# Patient Record
Sex: Male | Born: 1955
Health system: Southern US, Community
[De-identification: ages and names within clinical notes are randomized; demographics above are authoritative.]

## PROBLEM LIST (undated history)

## (undated) DIAGNOSIS — R251 Tremor, unspecified: Secondary | ICD-10-CM

## (undated) DIAGNOSIS — M419 Scoliosis, unspecified: Secondary | ICD-10-CM

## (undated) DIAGNOSIS — C4491 Basal cell carcinoma of skin, unspecified: Secondary | ICD-10-CM

## (undated) DIAGNOSIS — N200 Calculus of kidney: Secondary | ICD-10-CM

## (undated) DIAGNOSIS — G43009 Migraine without aura, not intractable, without status migrainosus: Secondary | ICD-10-CM

## (undated) DIAGNOSIS — R413 Other amnesia: Secondary | ICD-10-CM

## (undated) DIAGNOSIS — M5430 Sciatica, unspecified side: Secondary | ICD-10-CM

## (undated) HISTORY — DX: Other amnesia: R41.3

## (undated) HISTORY — PX: SHOULDER SURGERY: SHX246

## (undated) HISTORY — DX: Tremor, unspecified: R25.1

## (undated) HISTORY — PX: KIDNEY STONE SURGERY: SHX686

## (undated) HISTORY — DX: Migraine without aura, not intractable, without status migrainosus: G43.009

## (undated) HISTORY — DX: Basal cell carcinoma of skin, unspecified: C44.91

## (undated) HISTORY — DX: Sciatica, unspecified side: M54.30

## (undated) HISTORY — PX: HERNIA REPAIR: SHX51

---

## 1997-09-11 ENCOUNTER — Emergency Department (HOSPITAL_COMMUNITY): Admission: EM | Admit: 1997-09-11 | Discharge: 1997-09-11 | Payer: Self-pay | Admitting: Emergency Medicine

## 1999-07-02 ENCOUNTER — Encounter: Payer: Self-pay | Admitting: Emergency Medicine

## 1999-07-02 ENCOUNTER — Emergency Department (HOSPITAL_COMMUNITY): Admission: EM | Admit: 1999-07-02 | Discharge: 1999-07-02 | Payer: Self-pay | Admitting: Emergency Medicine

## 1999-12-22 ENCOUNTER — Ambulatory Visit (HOSPITAL_BASED_OUTPATIENT_CLINIC_OR_DEPARTMENT_OTHER): Admission: RE | Admit: 1999-12-22 | Discharge: 1999-12-22 | Payer: Self-pay | Admitting: Family Medicine

## 2000-02-28 ENCOUNTER — Ambulatory Visit (HOSPITAL_BASED_OUTPATIENT_CLINIC_OR_DEPARTMENT_OTHER): Admission: RE | Admit: 2000-02-28 | Discharge: 2000-02-28 | Payer: Self-pay | Admitting: General Surgery

## 2000-12-13 ENCOUNTER — Other Ambulatory Visit: Admission: RE | Admit: 2000-12-13 | Discharge: 2000-12-13 | Payer: Self-pay | Admitting: Urology

## 2005-10-11 ENCOUNTER — Ambulatory Visit (HOSPITAL_BASED_OUTPATIENT_CLINIC_OR_DEPARTMENT_OTHER): Admission: RE | Admit: 2005-10-11 | Discharge: 2005-10-12 | Payer: Self-pay | Admitting: Orthopedic Surgery

## 2012-06-13 ENCOUNTER — Emergency Department (INDEPENDENT_AMBULATORY_CARE_PROVIDER_SITE_OTHER): Payer: BC Managed Care – PPO

## 2012-06-13 ENCOUNTER — Emergency Department (HOSPITAL_COMMUNITY)
Admission: EM | Admit: 2012-06-13 | Discharge: 2012-06-13 | Disposition: A | Discharge status: Home / Self Care | Payer: BC Managed Care – PPO | Source: Home / Self Care

## 2012-06-13 ENCOUNTER — Encounter (HOSPITAL_COMMUNITY): Payer: Self-pay | Admitting: *Deleted

## 2012-06-13 DIAGNOSIS — R109 Unspecified abdominal pain: Secondary | ICD-10-CM

## 2012-06-13 DIAGNOSIS — K5900 Constipation, unspecified: Secondary | ICD-10-CM

## 2012-06-13 HISTORY — DX: Calculus of kidney: N20.0

## 2012-06-13 HISTORY — DX: Scoliosis, unspecified: M41.9

## 2012-06-13 LAB — POCT I-STAT, CHEM 8
BUN: 16 mg/dL (ref 6–23)
Calcium, Ion: 1.16 mmol/L (ref 1.12–1.23)
Chloride: 102 mEq/L (ref 96–112)
Creatinine, Ser: 1.2 mg/dL (ref 0.50–1.35)
Glucose, Bld: 98 mg/dL (ref 70–99)
HCT: 44 % (ref 39.0–52.0)
Hemoglobin: 15 g/dL (ref 13.0–17.0)
Potassium: 4 mEq/L (ref 3.5–5.1)
Sodium: 141 mEq/L (ref 135–145)
TCO2: 28 mmol/L (ref 0–100)

## 2012-06-13 LAB — POCT URINALYSIS DIP (DEVICE)
Bilirubin Urine: NEGATIVE
Glucose, UA: NEGATIVE mg/dL
Hgb urine dipstick: NEGATIVE
Ketones, ur: NEGATIVE mg/dL
Leukocytes, UA: NEGATIVE
Nitrite: NEGATIVE
Protein, ur: NEGATIVE mg/dL
Specific Gravity, Urine: >=1.03 (ref 1.005–1.030)
Urobilinogen, UA: 0.2 mg/dL (ref 0.0–1.0)
pH: 5.5 (ref 5.0–8.0)

## 2012-06-13 NOTE — ED Provider Notes (Signed)
History     CSN: [id number]  Arrival date & time 06/13/12  1608   None     Chief Complaint  Patient presents with  . Abdominal Pain    (Consider location/radiation/quality/duration/timing/severity/associated sxs/prior treatment) HPI Comments: 57 year old male is complaining of right mid abdominal pain radiating through to the right mid to low back. It began yesterday morning and has persisted throughout today. The pain is constant. It is worse when riding over bumps in the road. He describes it as a sharp, stabbing or burning pain. It is nonradiating. He has never had similar pain. It is not like previous pain associated with kidney stones.   Past Medical History  Diagnosis Date  . Kidney stones   . Scoliosis     Past Surgical History  Procedure Laterality Date  . Hernia repair    . Shoulder surgery Right   . Kidney stone surgery      History reviewed. No pertinent family history.  History  Substance Use Topics  . Smoking status: Never Smoker   . Smokeless tobacco: Not on file  . Alcohol Use: Yes     Comment: occasional      Review of Systems  Constitutional: Negative.   Respiratory: Negative.   Cardiovascular: Negative.   Gastrointestinal: Negative.   Genitourinary: Negative.   Musculoskeletal: Positive for back pain.  Skin: Negative.   Neurological: Negative.   Hematological: Negative.   Psychiatric/Behavioral: Negative.     Allergies  Ivp dye and Sulfites  Home Medications  No current outpatient prescriptions on file.  BP 126/81  Pulse 86  Temp(Src) 98.1 F (36.7 C) (Oral)  Resp 16  SpO2 97%  Physical Exam  Nursing note and vitals reviewed. Constitutional: He appears well-developed and well-nourished. No distress.  Eyes: EOM are normal.  Neck: Normal range of motion. Neck supple.  Cardiovascular: Normal rate, regular rhythm and normal heart sounds.   Pulmonary/Chest: Effort normal and breath sounds normal. No respiratory distress.   Abdominal: Soft. Bowel sounds are normal. He exhibits no mass. There is no rebound and no guarding.  Mild reproducible tenderness in the right midabdomen approximately 5 cm right of the umbilicus. No right lower quadrant tenderness. No Murphy sign.  Musculoskeletal:  Due to chronic spinal disorder he has chronic intermittent low back pain it radiates to the right. There is mild tenderness to the area for which the abdominal pain radiates however this is different from the pain for which he presents today.  Neurological: He is alert. He exhibits normal muscle tone.  Skin: Skin is warm. No rash noted. No erythema.  Psychiatric: He has a normal mood and affect.    ED Course  Procedures (including critical care time)  Labs Reviewed  POCT URINALYSIS DIP (DEVICE)  POCT I-STAT, CHEM 8   Dg Abd 1 View  06/13/2012  *RADIOLOGY REPORT*  Clinical Data: Right abdominal pain  ABDOMEN - 1 VIEW  Comparison: None.  Findings: Nonobstructive bowel gas pattern.  Moderate stool in the right colon.  Calcified phleboliths in the left pelvis.  Lumbar dextroscoliosis.  IMPRESSION: Unremarkable abdominal radiograph.   Original Report Authenticated By: Charline Bills, M.D.      1. Abdominal  pain, other specified site   2. Constipation       MDM  I-STAT and urinalysis are normal. KUB shows normal stool and gas pattern however there is a copious amount of stool in the ascending and descending colon and rectum. I suspect that with a negative review of systems  that he is having intestinal/gas pain. Recommend using MiraLax increase fluids and fiber. For worsening pain he symptoms problems, fever, vomiting or to the emergency department. Otherwise, followup with her primary care doctor.        Hayden Rasmussen, NP 06/13/12 1936

## 2012-06-13 NOTE — ED Provider Notes (Signed)
Medical screening examination/treatment/procedure(s) were performed by resident physician or non-physician practitioner and as supervising physician I was immediately available for consultation/collaboration.   Teagan Ozawa DOUGLAS MD.   Helia Haese D Chloe Baig, MD 06/13/12 1957 

## 2012-06-13 NOTE — ED Notes (Signed)
C/o R sided abdominal pain beside umbilicus and R flank pain onset yesterday as a dull pain. Today pain is sharper and burning. Hx. Of kidney stones calcium based and gall bladder pain.  No urinary symptoms or hematuria.  C/o nasuea today. No vomiting, diarrhea or constipation.  No chills or fever.

## 2012-11-05 ENCOUNTER — Encounter: Payer: Self-pay | Admitting: Neurology

## 2012-11-05 DIAGNOSIS — R413 Other amnesia: Secondary | ICD-10-CM | POA: Insufficient documentation

## 2012-11-05 DIAGNOSIS — G250 Essential tremor: Secondary | ICD-10-CM | POA: Insufficient documentation

## 2012-11-05 DIAGNOSIS — G43009 Migraine without aura, not intractable, without status migrainosus: Secondary | ICD-10-CM | POA: Insufficient documentation

## 2012-11-10 ENCOUNTER — Encounter: Payer: Self-pay | Admitting: Neurology

## 2012-11-10 ENCOUNTER — Ambulatory Visit (INDEPENDENT_AMBULATORY_CARE_PROVIDER_SITE_OTHER): Payer: BC Managed Care – PPO | Admitting: Neurology

## 2012-11-10 VITALS — BP 131/84 | HR 88 | Ht 67.5 in | Wt 178.0 lb

## 2012-11-10 DIAGNOSIS — M5430 Sciatica, unspecified side: Secondary | ICD-10-CM

## 2012-11-10 DIAGNOSIS — G43009 Migraine without aura, not intractable, without status migrainosus: Secondary | ICD-10-CM

## 2012-11-10 DIAGNOSIS — R413 Other amnesia: Secondary | ICD-10-CM

## 2012-11-10 DIAGNOSIS — M5432 Sciatica, left side: Secondary | ICD-10-CM

## 2012-11-10 DIAGNOSIS — G250 Essential tremor: Secondary | ICD-10-CM

## 2012-11-10 HISTORY — DX: Sciatica, unspecified side: M54.30

## 2012-11-10 MED ORDER — MELOXICAM 15 MG PO TABS: 15.0000 mg | ORAL_TABLET | Freq: Every day | ORAL | Status: DC

## 2012-11-10 NOTE — Progress Notes (Signed)
Reason for visit: Headache  Sean Pugh is an 57 y.o. male  History of present illness:  Sean Pugh is a 57 year old right-handed white male with a history of migraine headaches. The patient has done fairly well with his headaches, having less than one headache a month. The patient will take over the counter headache medications with good improvement. The patient indicates that the headaches are not disrupting his life. In March 2014, the patient fell and injured his low back. The patient has had some intermittent left sided low back pain, with occasional tingling discomfort down the left leg that goes into the big toe and the second and third toes. The patient denies any weakness of the leg, and he indicates that the symptoms are intermittent, not constant. The patient will put BenGay ointment on his back, and this will help his symptoms. The patient is receiving massage therapy, and he has an inversion frame at home. The patient denies any significant issues with memory, and he indicates that this is a stable issue. The patient returns for an evaluation.  Past Medical History  Diagnosis Date  . Kidney stones   . Scoliosis   . Memory loss   . Tremor   . Migraine headache without aura   . Sciatica 11/10/2012    Left-sided sciatica    Past Surgical History  Procedure Laterality Date  . Hernia repair    . Shoulder surgery Right   . Kidney stone surgery      Family History  Problem Relation Age of Onset  . Cancer Father   . Stroke Paternal Grandmother   . Cancer Paternal Grandfather   . Multiple sclerosis Cousin   . Diabetes Cousin   . Migraines Son     Social history:  reports that he has never smoked. He has never used smokeless tobacco. He reports that  drinks alcohol. He reports that he does not use illicit drugs.    Allergies  Allergen Reactions  . Ivp Dye [Iodinated Diagnostic Agents]     Swelling, rash  . Sulfites     Breathing,rash, breakout    Medications:   Current Outpatient Prescriptions on File Prior to Visit  Medication Sig Dispense Refill  . aspirin 81 MG tablet Take 81 mg by mouth daily.      . Multiple Vitamin (MULTIVITAMIN) tablet Take 1 tablet by mouth daily.      . pantoprazole (PROTONIX) 40 MG tablet Take 40 mg by mouth daily.       No current facility-administered medications on file prior to visit.    ROS:  Out of a complete 14 system review of symptoms, the patient complains only of the following symptoms, and all other reviewed systems are negative.  Birthmarks Snoring Allergies  Blood pressure 131/84, pulse 88, height 5' 7.5" (1.715 m), weight 178 lb (80.74 kg).  Physical Exam  General: The patient is alert and cooperative at the time of the examination.  Neuromuscular: The patient has excellent range of motion of the low back. No pain with palpation or percussion of the low back is noted.  Skin: No significant peripheral edema is noted.   Neurologic Exam  Cranial nerves: Facial symmetry is present. Speech is normal, no aphasia or dysarthria is noted. Extraocular movements are full. Visual fields are full.  Motor: The patient has good strength in all 4 extremities. The patient is able to walk on heels and the toes.  Coordination: The patient has good finger-nose-finger and heel-to-shin bilaterally.  Gait and station: The patient has a normal gait. Tandem gait is normal. Romberg is negative. No drift is seen.  Reflexes: Deep tendon reflexes are symmetric.   Assessment/Plan:  1. Migraine headache  2. Low back pain, left leg discomfort  The patient likely has a low-grade L5 nerve root impingement, but he has minimal symptoms at this point. I will give him a six-week trial on Mobic to see if he improves. If the back pain significant worsens, MRI evaluation of the back can be done in the future. The patient will followup through this office in 6 months. The migraine headaches are doing quite well.  Marlan Palau MD 11/10/2012 8:46 PM  [facility] [address] [place], [place] [address]  Phone [phone] Fax [phone]

## 2012-11-13 ENCOUNTER — Telehealth: Payer: Self-pay

## 2012-11-13 NOTE — Telephone Encounter (Signed)
I called patient. The patient indicates that he has a sulfa drug allergy, not an allergy to sulfites. The medication Mobic is not a sulfa drug, the pharmacist may have had this as an allergy as it may have potentially sulfites preservatives in it. It is probably okay for this patient to take Mobic.

## 2012-11-13 NOTE — Telephone Encounter (Signed)
Patient called, left message saying he has picked up the Rx that was called in, however, he is concerned he may have an allergic reaction to the medication because of his allergy to Sulfites.  He would like to speak with the provider to verify it is okay for him to take Mobic prior to starting the medication.  Call back number [phone].  Please advise.  Thank you.

## 2012-11-13 NOTE — Telephone Encounter (Signed)
I called patient.  I left a message, I will call back later. 

## 2013-05-12 ENCOUNTER — Ambulatory Visit (INDEPENDENT_AMBULATORY_CARE_PROVIDER_SITE_OTHER): Payer: BC Managed Care – PPO | Admitting: Nurse Practitioner

## 2013-05-12 ENCOUNTER — Encounter (INDEPENDENT_AMBULATORY_CARE_PROVIDER_SITE_OTHER): Payer: Self-pay

## 2013-05-12 ENCOUNTER — Encounter: Payer: Self-pay | Admitting: Nurse Practitioner

## 2013-05-12 VITALS — BP 135/77 | HR 79 | Ht 68.0 in | Wt 183.0 lb

## 2013-05-12 DIAGNOSIS — G43009 Migraine without aura, not intractable, without status migrainosus: Secondary | ICD-10-CM

## 2013-05-12 DIAGNOSIS — G250 Essential tremor: Secondary | ICD-10-CM

## 2013-05-12 DIAGNOSIS — M5430 Sciatica, unspecified side: Secondary | ICD-10-CM

## 2013-05-12 DIAGNOSIS — G252 Other specified forms of tremor: Secondary | ICD-10-CM

## 2013-05-12 NOTE — Patient Instructions (Signed)
Memory and headaches are stable F/U PRN only

## 2013-05-12 NOTE — Progress Notes (Signed)
I have read the note, and I agree with the clinical assessment and plan.  Kensli Bowley KEITH   

## 2013-05-12 NOTE — Progress Notes (Signed)
[facility]  PATIENT: ELLIE SPICKLER DOB: 02/19/1956   REASON FOR VISIT: Followup memory loss, headaches, back pain    HISTORY OF PRESENT ILLNESS: Mr. Kluth, 58 year old male returns for followup. He was last seen in this office by Dr. Jannifer Franklin 9/ 8/ 2014. At that time he was having low back pain and left leg discomfort. He took [facility] for 6 weeks and the pain resolved. His memory is stable. He has a rare headache. He denies any weakness or other neurologic symptoms. He returns for reevaluation.   HISTORY: of migraine headaches. The patient has done fairly well with his headaches, having less than one headache a month. The patient will take over the counter headache medications with good improvement. The patient indicates that the headaches are not disrupting his life. In March 2014, the patient fell and injured his low back. The patient has had some intermittent left sided low back pain, with occasional tingling discomfort down the left leg that goes into the big toe and the second and third toes. The patient denies any weakness of the leg, and he indicates that the symptoms are intermittent, not constant. The patient will put BenGay ointment on his back, and this will help his symptoms. The patient is receiving massage therapy, and he has an inversion frame at home. The patient denies any significant issues with memory, and he indicates that this is a stable issue. The patient returns for an evaluation.    REVIEW OF SYSTEMS: Full 14 system review of systems performed and notable only for those listed, all others are neg:  Constitutional: N/A  Cardiovascular: N/A  Ear/Nose/Throat: reflux Skin: N/A  Eyes: N/A  Respiratory: N/A  Gastroitestinal: N/A  Hematology/Lymphatic: N/A  Endocrine: N/A Musculoskeletal:N/A  Allergy/Immunology: Food allergies Neurological: Rare headache  Psychiatric: N/A   ALLERGIES: Allergies  Allergen Reactions  . Ivp Dye [Iodinated  Diagnostic Agents]     Swelling, rash  . Sulfa Antibiotics Rash    HOME MEDICATIONS: Outpatient Prescriptions Prior to Visit  Medication Sig Dispense Refill  . aspirin 81 MG tablet Take 81 mg by mouth daily.      . Multiple Vitamin (MULTIVITAMIN) tablet Take 1 tablet by mouth daily.      . pantoprazole (PROTONIX) 40 MG tablet Take 40 mg by mouth as needed.       . meloxicam (MOBIC) 15 MG tablet Take 1 tablet (15 mg total) by mouth daily.  30 tablet  1   No facility-administered medications prior to visit.    PAST MEDICAL HISTORY: Past Medical History  Diagnosis Date  . Kidney stones   . Scoliosis   . Memory loss   . Tremor   . Migraine headache without aura   . Sciatica 11/10/2012    Left-sided sciatica    PAST SURGICAL HISTORY: Past Surgical History  Procedure Laterality Date  . Hernia repair    . Shoulder surgery Right   . Kidney stone surgery      FAMILY HISTORY: Family History  Problem Relation Age of Onset  . Cancer Father   . Stroke Paternal Grandmother   . Cancer Paternal Grandfather   . Multiple sclerosis Cousin   . Diabetes Cousin   . Migraines Son     SOCIAL HISTORY: History   Social History  . Marital Status: Married    Spouse Name: [name]     Number of Children: 1  . Years of Education: COLLEGE   Occupational History  . Not on file.  Social History Main Topics  . Smoking status: Never Smoker   . Smokeless tobacco: Never Used  . Alcohol Use: Yes     Comment: occasional  . Drug Use: No  . Sexual Activity: Not on file   Other Topics Concern  . Not on file   Social History Narrative   Patient lives at home with wife and son. 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    Patient has one child.      PHYSICAL EXAM  Filed Vitals:   05/12/13 0823  BP: 135/77  Pulse: 79  Height: 5\' 8"  (1.727 m)  Weight: 183 lb (83.008 kg)   Body mass index is 27.83 kg/(m^2).  Generalized: Well  developed, in no acute distress  Head: normocephalic and atraumatic,. Oropharynx benign  Neck: Supple, no carotid bruits  Cardiac: Regular rate rhythm, no murmur  Musculoskeletal: No deformity   Neurological examination   Mentation: Alert oriented to time, place, history taking. Follows all commands speech and language fluent. MMSE 29/30. AFT 25.  Cranial nerve II-XII: Pupils were equal round reactive to light extraocular movements were full, visual field were full on confrontational test. Facial sensation and strength were normal. hearing was intact to finger rubbing bilaterally. Uvula tongue midline. head turning and shoulder shrug were normal and symmetric.Tongue protrusion into cheek strength was normal. Motor: normal bulk and tone, full strength in the BUE, BLE,  No focal weakness Coordination: finger-nose-finger, heel-to-shin bilaterally, no dysmetria Reflexes: Brachioradialis 2/2, biceps 2/2, triceps 2/2, patellar 2/2, Achilles 2/2, plantar responses were flexor bilaterally. Gait and Station: Rising up from seated position without assistance, normal stance,  moderate stride, good arm swing, smooth turning, able to perform tiptoe, and heel walking without difficulty. Tandem gait is steady  DIAGNOSTIC DATA (LABS, IMAGING, TESTING)  ASSESSMENT AND PLAN  58 y.o. year old male  has a past medical history of  Memory loss; Tremor; Migraine headache without aura; and Sciatica (11/10/2012). here in followup. His memory and headaches are stable. His back pain is much improved.  F/U PRN only Dennie Bible, [facility], [facility], APRN  [facility] [address] [place], [place] [address] [phone]

## 2015-03-15 ENCOUNTER — Other Ambulatory Visit (HOSPITAL_COMMUNITY): Payer: Self-pay | Admitting: Respiratory Therapy

## 2015-03-15 DIAGNOSIS — R053 Chronic cough: Secondary | ICD-10-CM

## 2015-03-15 DIAGNOSIS — R05 Cough: Secondary | ICD-10-CM

## 2015-03-21 ENCOUNTER — Ambulatory Visit (HOSPITAL_COMMUNITY)
Admission: RE | Admit: 2015-03-21 | Discharge: 2015-03-21 | Disposition: A | Discharge status: Home / Self Care | Payer: BLUE CROSS/BLUE SHIELD | Source: Ambulatory Visit | Attending: Internal Medicine | Admitting: Internal Medicine

## 2015-03-21 DIAGNOSIS — R05 Cough: Secondary | ICD-10-CM | POA: Insufficient documentation

## 2015-03-23 LAB — PULMONARY FUNCTION TEST
DL/VA % PRED: 96 %
DL/VA: 4.28 ml/min/mmHg/L
DLCO unc % pred: 76 %
DLCO unc: 21.63 ml/min/mmHg
FEF 25-75 PRE: 1.81 L/s
FEF2575-%PRED-PRE: 65 %
FEV1-%PRED-PRE: 78 %
FEV1-PRE: 2.6 L
FEV1FVC-%Pred-Pre: 96 %
FEV6-%PRED-PRE: 82 %
FEV6-Pre: 3.42 L
FEV6FVC-%PRED-PRE: 101 %
FVC-%Pred-Pre: 81 %
FVC-Pre: 3.54 L
PRE FEV1/FVC RATIO: 73 %
Pre FEV6/FVC Ratio: 97 %
RV % PRED: 97 %
RV: 1.99 L
TLC % pred: 87 %
TLC: 5.59 L

## 2015-09-12 ENCOUNTER — Encounter: Payer: Self-pay | Admitting: Nurse Practitioner

## 2015-09-12 ENCOUNTER — Ambulatory Visit (INDEPENDENT_AMBULATORY_CARE_PROVIDER_SITE_OTHER): Payer: BLUE CROSS/BLUE SHIELD | Admitting: Nurse Practitioner

## 2015-09-12 VITALS — BP 125/87 | HR 90 | Ht 68.0 in | Wt 181.6 lb

## 2015-09-12 DIAGNOSIS — M5430 Sciatica, unspecified side: Secondary | ICD-10-CM

## 2015-09-12 DIAGNOSIS — G43009 Migraine without aura, not intractable, without status migrainosus: Secondary | ICD-10-CM

## 2015-09-12 MED ORDER — MELOXICAM 15 MG PO TABS: 15.0000 mg | ORAL_TABLET | Freq: Every day | ORAL | Status: DC

## 2015-09-12 MED ORDER — PREDNISONE 10 MG PO TABS: 10.0000 mg | ORAL_TABLET | Freq: Every day | ORAL | Status: DC

## 2015-09-12 NOTE — Patient Instructions (Addendum)
Prednisone Dosepak for migraine Begin Mobic daily for sciatica Continue stretching exercises Follow-up in 2 months If the pain does not get better will do MRI

## 2015-09-12 NOTE — Progress Notes (Signed)
[facility]  PATIENT: Sean Pugh DOB: [birth year]   REASON FOR VISIT: Follow-up for history of migraine and sciatica, low back pain HISTORY FROM: Patient    HISTORY OF PRESENT ILLNESS:UPDATE 07/10/2017CM Sean Pugh 60 year old male returns for follow-up. He was last seen in the office March 2015 he has a long history of migraines and has about 4 migraines per year however his recent migraine has been going on for about 4 days now and he has not gotten relief with Advil. In addition his low back pain has resurfaced again with occasional  tingling and discomfort down the left leg that goes into the big toe and the second and third toes. The patient does not have any weakness with this and his symptoms are not constant. He continues to do his stretching exercises He returns for reevaluation   UPDATE 05/12/2013 CMMr. Sean Pugh, 60 year old male returns for followup. He was last seen in this office by Dr. Jannifer Franklin 9/ 8/ 2014. At that time he was having low back pain and left leg discomfort. He took [facility] for 6 weeks and the pain resolved. His memory is stable. He has a rare headache. He denies any weakness or other neurologic symptoms. He returns for reevaluation.   HISTORY: of migraine headaches. The patient has done fairly well with his headaches, having less than one headache a month. The patient will take over the counter headache medications with good improvement. The patient indicates that the headaches are not disrupting his life. In March 2014, the patient fell and injured his low back. The patient has had some intermittent left sided low back pain, with occasional tingling discomfort down the left leg that goes into the big toe and the second and third toes. The patient denies any weakness of the leg, and he indicates that the symptoms are intermittent, not constant. The patient will put BenGay ointment on his back, and this will help his symptoms. The patient is receiving  massage therapy, and he has an inversion frame at home. The patient denies any significant issues with memory, and he indicates that this is a stable issue. The patient returns for an evaluation.    REVIEW OF SYSTEMS: Full 14 system review of systems performed and notable only for those listed, all others are neg:  Constitutional: neg  Cardiovascular: neg Ear/Nose/Throat: neg  Skin: neg Eyes: neg Respiratory: neg Gastroitestinal: neg  Hematology/Lymphatic: neg  Endocrine: neg Musculoskeletal: Low back pain Allergy/Immunology: neg Neurological: Headache, numbness Psychiatric: neg Sleep : neg   ALLERGIES: Allergies  Allergen Reactions  . Ivp Dye [Iodinated Diagnostic Agents]     Swelling, rash  . Sulfa Antibiotics Rash    HOME MEDICATIONS: Outpatient Prescriptions Prior to Visit  Medication Sig Dispense Refill  . aspirin 81 MG tablet Take 81 mg by mouth daily.    . Multiple Vitamin (MULTIVITAMIN) tablet Take 1 tablet by mouth daily.    . pantoprazole (PROTONIX) 40 MG tablet Take 40 mg by mouth as needed. Reported on 09/12/2015     No facility-administered medications prior to visit.    PAST MEDICAL HISTORY: Past Medical History  Diagnosis Date  . Kidney stones   . Scoliosis   . Memory loss   . Tremor   . Migraine headache without aura   . Sciatica 11/10/2012    Left-sided sciatica    PAST SURGICAL HISTORY: Past Surgical History  Procedure Laterality Date  . Hernia repair    . Shoulder surgery Right   . Kidney  stone surgery      FAMILY HISTORY: Family History  Problem Relation Age of Onset  . Cancer Father   . Stroke Paternal Grandmother   . Cancer Paternal Grandfather   . Multiple sclerosis Cousin   . Diabetes Cousin   . Migraines Son     SOCIAL HISTORY: Social History   Social History  . Marital Status: Married    Spouse Name: [name]   . Number of Children: 1  . Years of Education: COLLEGE   Occupational History  . Not on file.   Social  History Main Topics  . Smoking status: Never Smoker   . Smokeless tobacco: Never Used  . Alcohol Use: Yes     Comment: occasional  . Drug Use: No  . Sexual Activity: Not on file   Other Topics Concern  . Not on file   Social History Narrative   Patient lives at home with wife and son. 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    Patient has one child.      PHYSICAL EXAM  Filed Vitals:   09/12/15 1543  BP: 125/87  Pulse: 90  Height: 5\' 8"  (1.727 m)  Weight: 181 lb 9.6 oz (82.373 kg)   Body mass index is 27.62 kg/(m^2). Generalized: Well developed, in no acute distress  Head: normocephalic and atraumatic,. Oropharynx benign  Neck: Supple, no carotid bruits  Cardiac: Regular rate rhythm, no murmur  Musculoskeletal: No deformity   Neurological examination   Mentation: Alert oriented to time, place, history taking. Follows all commands speech and language fluent.  Cranial nerve II-XII: Pupils were equal round reactive to light extraocular movements were full, visual field were full on confrontational test. Facial sensation and strength were normal. hearing was intact to finger rubbing bilaterally. Uvula tongue midline. head turning and shoulder shrug were normal and symmetric.Tongue protrusion into cheek strength was normal. Motor: normal bulk and tone, full strength in the BUE, BLE, No focal weakness Coordination: finger-nose-finger, heel-to-shin bilaterally, no dysmetria Reflexes: Brachioradialis 2/2, biceps 2/2, triceps 2/2, patellar 2/2, Achilles 2/2, plantar responses were flexor bilaterally. Gait and Station: Rising up from seated position without assistance, normal stance, moderate stride, good arm swing, smooth turning, able to perform tiptoe, and heel walking without difficulty. Tandem gait is steady  DIAGNOSTIC DATA (LABS, IMAGING, TESTING) -  ASSESSMENT AND PLAN  60 y.o. year old male  has a past medical history of   Migraine headache without aura; and Sciatica (11/10/2012). here to follow-up. His migraines has been going on for 4 days and his low back pain has started again intermittently  PLAN: Prednisone Dosepak for migraine Begin Mobic daily for sciatica, low back pain Continue stretching exercises Follow-up in 2 months If the pain does not get better will do MRI of the lumbar spine in the near future Sean Pugh, [facility], [facility], [facility] [address] [place], [place] [address] [phone]

## 2015-09-13 NOTE — Progress Notes (Signed)
I have read the note, and I agree with the clinical assessment and plan.  Tyner Codner KEITH   

## 2015-11-14 ENCOUNTER — Ambulatory Visit (INDEPENDENT_AMBULATORY_CARE_PROVIDER_SITE_OTHER): Payer: BLUE CROSS/BLUE SHIELD | Admitting: Nurse Practitioner

## 2015-11-14 ENCOUNTER — Encounter: Payer: Self-pay | Admitting: Nurse Practitioner

## 2015-11-14 VITALS — BP 124/76 | HR 96 | Ht 68.0 in | Wt 179.2 lb

## 2015-11-14 DIAGNOSIS — M5430 Sciatica, unspecified side: Secondary | ICD-10-CM

## 2015-11-14 DIAGNOSIS — G43009 Migraine without aura, not intractable, without status migrainosus: Secondary | ICD-10-CM | POA: Diagnosis not present

## 2015-11-14 NOTE — Patient Instructions (Signed)
Continue stretching exercises Migraines and back pain in good control Follow up as needed

## 2015-11-14 NOTE — Progress Notes (Signed)
[facility]  PATIENT: Sean Pugh DOB: Jul 27, 1955   REASON FOR VISIT: Follow-up for history of migraine and sciatica, low back pain HISTORY FROM: Patient    HISTORY OF PRESENT ILLNESS:UPDATE 09/11/2017CM [name], 60 year old male returns for follow-up. When last seen in August he had a migraine for four days along with return of his previous back pain. He was given a prednisone Dosepak and Mobic. He is now doing stretching exercises. His migraines and back pain are much improved. He  takes an occasional Advil. He returns for reevaluation   UPDATE 07/10/2017CM [name] 60 year old male returns for follow-up. He was last seen in the office March 2015 he has a long history of migraines and has about 4 migraines per year however his recent migraine has been going on for about 4 days now and he has not gotten relief with Advil. In addition his low back pain has resurfaced again with occasional  tingling and discomfort down the left leg that goes into the big toe and the second and third toes. The patient does not have any weakness with this and his symptoms are not constant. He continues to do his stretching exercises He returns for reevaluation   UPDATE 05/12/2013 CMMr. Recendiz, 60 year old male returns for followup. He was last seen in this office by Dr. Jannifer Franklin 9/ 8/ 2014. At that time he was having low back pain and left leg discomfort. He took [facility] for 6 weeks and the pain resolved. His memory is stable. He has a rare headache. He denies any weakness or other neurologic symptoms. He returns for reevaluation.   HISTORY: of migraine headaches. The patient has done fairly well with his headaches, having less than one headache a month. The patient will take over the counter headache medications with good improvement. The patient indicates that the headaches are not disrupting his life. In March 2014, the patient fell and injured his low back. The patient has had some  intermittent left sided low back pain, with occasional tingling discomfort down the left leg that goes into the big toe and the second and third toes. The patient denies any weakness of the leg, and he indicates that the symptoms are intermittent, not constant. The patient will put BenGay ointment on his back, and this will help his symptoms. The patient is receiving massage therapy, and he has an inversion frame at home. The patient denies any significant issues with memory, and he indicates that this is a stable issue. The patient returns for an evaluation.    REVIEW OF SYSTEMS: Full 14 system review of systems performed and notable only for those listed, all others are neg:  Constitutional: neg  Cardiovascular: neg Ear/Nose/Throat: neg  Skin: neg Eyes: neg Respiratory: neg Gastroitestinal: neg  Hematology/Lymphatic: neg  Endocrine: neg Musculoskeletal:  Allergy/Immunology: neg Neurological:  Psychiatric: neg Sleep : neg   ALLERGIES: Allergies  Allergen Reactions  . Ivp Dye [Iodinated Diagnostic Agents]     Swelling, rash  . Sulfa Antibiotics Rash    HOME MEDICATIONS: Outpatient Medications Prior to Visit  Medication Sig Dispense Refill  . aspirin 81 MG tablet Take 81 mg by mouth daily.    Marland Kitchen ibuprofen (ADVIL,MOTRIN) 200 MG tablet Take by mouth every 6 (six) hours as needed. 1 -2 tabs prn headache.    . Multiple Vitamin (MULTIVITAMIN) tablet Take 1 tablet by mouth daily.    . meloxicam (MOBIC) 15 MG tablet Take 1 tablet (15 mg total) by mouth daily. (Patient not  taking: Reported on 11/14/2015) 30 tablet 3  . predniSONE (DELTASONE) 10 MG tablet Take 1 tablet (10 mg total) by mouth daily with breakfast. 6 day dose pack (Patient not taking: Reported on 11/14/2015) 21 tablet 0   No facility-administered medications prior to visit.     PAST MEDICAL HISTORY: Past Medical History:  Diagnosis Date  . Kidney stones   . Memory loss   . Migraine headache without aura   . Sciatica  11/10/2012   Left-sided sciatica  . Scoliosis   . Tremor     PAST SURGICAL HISTORY: Past Surgical History:  Procedure Laterality Date  . HERNIA REPAIR    . KIDNEY STONE SURGERY    . SHOULDER SURGERY Right     FAMILY HISTORY: Family History  Problem Relation Age of Onset  . Cancer Father   . Stroke Paternal Grandmother   . Cancer Paternal Grandfather   . Multiple sclerosis Cousin   . Diabetes Cousin   . Migraines Son     SOCIAL HISTORY: Social History   Social History  . Marital status: Married    Spouse name: [name]   . Number of children: 1  . Years of education: COLLEGE   Occupational History  . Not on file.   Social History Main Topics  . Smoking status: Never Smoker  . Smokeless tobacco: Never Used  . Alcohol use Yes     Comment: occasional  . Drug use: No  . Sexual activity: Not on file   Other Topics Concern  . Not on file   Social History Narrative   Patient lives at home with wife and son. 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    Patient has one child.      PHYSICAL EXAM  Vitals:   11/14/15 0805  BP: 124/76  Pulse: 96  Weight: 179 lb 3.2 oz (81.3 kg)  Height: 5\' 8"  (1.727 m)   Body mass index is 27.25 kg/m. Generalized: Well developed, in no acute distress  Head: normocephalic and atraumatic,. Oropharynx benign  Neck: Supple, no carotid bruits  Cardiac: Regular rate rhythm, no murmur  Musculoskeletal: No deformity   Neurological examination   Mentation: Alert oriented to time, place, history taking. Follows all commands speech and language fluent.  Cranial nerve II-XII: Pupils were equal round reactive to light extraocular movements were full, visual field were full on confrontational test. Facial sensation and strength were normal. hearing was intact to finger rubbing bilaterally. Uvula tongue midline. head turning and shoulder shrug were normal and symmetric.Tongue protrusion into  cheek strength was normal. Motor: normal bulk and tone, full strength in the BUE, BLE, No focal weakness Coordination: finger-nose-finger, heel-to-shin bilaterally, no dysmetria Reflexes: Brachioradialis 2/2, biceps 2/2, triceps 2/2, patellar 2/2, Achilles 2/2, plantar responses were flexor bilaterally. Gait and Station: Rising up from seated position without assistance, normal stance, moderate stride, good arm swing, smooth turning, able to perform tiptoe, and heel walking without difficulty. Tandem gait is steady  DIAGNOSTIC DATA (LABS, IMAGING, TESTING) -  ASSESSMENT AND PLAN  60 y.o. year old male  has a past medical history of  Migraine headache without aura; and Sciatica (11/10/2012). here to follow-up. His migraines and back pain are in excellent control.   PLAN: Continue stretching exercises Follow up as needed Dennie Bible, [facility], [facility], APRN  [facility] [address], [place], [place] [address] [phone]

## 2015-12-23 DIAGNOSIS — Z23 Encounter for immunization: Secondary | ICD-10-CM | POA: Diagnosis not present

## 2016-04-03 DIAGNOSIS — W57XXXA Bitten or stung by nonvenomous insect and other nonvenomous arthropods, initial encounter: Secondary | ICD-10-CM | POA: Diagnosis not present

## 2016-04-03 DIAGNOSIS — K219 Gastro-esophageal reflux disease without esophagitis: Secondary | ICD-10-CM | POA: Diagnosis not present

## 2016-04-03 DIAGNOSIS — J209 Acute bronchitis, unspecified: Secondary | ICD-10-CM | POA: Diagnosis not present

## 2016-05-31 DIAGNOSIS — Z1211 Encounter for screening for malignant neoplasm of colon: Secondary | ICD-10-CM | POA: Diagnosis not present

## 2016-07-13 DIAGNOSIS — Z1211 Encounter for screening for malignant neoplasm of colon: Secondary | ICD-10-CM | POA: Diagnosis not present

## 2016-07-13 DIAGNOSIS — D128 Benign neoplasm of rectum: Secondary | ICD-10-CM | POA: Diagnosis not present

## 2016-07-13 DIAGNOSIS — K621 Rectal polyp: Secondary | ICD-10-CM | POA: Diagnosis not present

## 2016-08-09 DIAGNOSIS — Z0000 Encounter for general adult medical examination without abnormal findings: Secondary | ICD-10-CM | POA: Diagnosis not present

## 2016-08-30 DIAGNOSIS — M4125 Other idiopathic scoliosis, thoracolumbar region: Secondary | ICD-10-CM | POA: Diagnosis not present

## 2016-08-30 DIAGNOSIS — M5136 Other intervertebral disc degeneration, lumbar region: Secondary | ICD-10-CM | POA: Diagnosis not present

## 2016-08-30 DIAGNOSIS — M4726 Other spondylosis with radiculopathy, lumbar region: Secondary | ICD-10-CM | POA: Diagnosis not present

## 2016-09-03 DIAGNOSIS — Z86018 Personal history of other benign neoplasm: Secondary | ICD-10-CM | POA: Diagnosis not present

## 2016-09-03 DIAGNOSIS — L814 Other melanin hyperpigmentation: Secondary | ICD-10-CM | POA: Diagnosis not present

## 2016-09-03 DIAGNOSIS — D225 Melanocytic nevi of trunk: Secondary | ICD-10-CM | POA: Diagnosis not present

## 2016-09-03 DIAGNOSIS — L821 Other seborrheic keratosis: Secondary | ICD-10-CM | POA: Diagnosis not present

## 2016-09-26 DIAGNOSIS — K219 Gastro-esophageal reflux disease without esophagitis: Secondary | ICD-10-CM | POA: Diagnosis not present

## 2016-09-26 DIAGNOSIS — G43909 Migraine, unspecified, not intractable, without status migrainosus: Secondary | ICD-10-CM | POA: Diagnosis not present

## 2016-09-26 DIAGNOSIS — R05 Cough: Secondary | ICD-10-CM | POA: Diagnosis not present

## 2016-09-26 DIAGNOSIS — Z0000 Encounter for general adult medical examination without abnormal findings: Secondary | ICD-10-CM | POA: Diagnosis not present

## 2016-09-26 DIAGNOSIS — Z1389 Encounter for screening for other disorder: Secondary | ICD-10-CM | POA: Diagnosis not present

## 2016-09-26 DIAGNOSIS — J45998 Other asthma: Secondary | ICD-10-CM | POA: Diagnosis not present

## 2016-12-14 DIAGNOSIS — Z23 Encounter for immunization: Secondary | ICD-10-CM | POA: Diagnosis not present

## 2017-05-27 DIAGNOSIS — R112 Nausea with vomiting, unspecified: Secondary | ICD-10-CM | POA: Diagnosis not present

## 2017-05-27 DIAGNOSIS — R197 Diarrhea, unspecified: Secondary | ICD-10-CM | POA: Diagnosis not present

## 2017-10-21 DIAGNOSIS — Z0000 Encounter for general adult medical examination without abnormal findings: Secondary | ICD-10-CM | POA: Diagnosis not present

## 2017-10-21 DIAGNOSIS — Z1389 Encounter for screening for other disorder: Secondary | ICD-10-CM | POA: Diagnosis not present

## 2017-10-21 DIAGNOSIS — R252 Cramp and spasm: Secondary | ICD-10-CM | POA: Diagnosis not present

## 2017-10-21 DIAGNOSIS — K219 Gastro-esophageal reflux disease without esophagitis: Secondary | ICD-10-CM | POA: Diagnosis not present

## 2017-10-21 DIAGNOSIS — F439 Reaction to severe stress, unspecified: Secondary | ICD-10-CM | POA: Diagnosis not present

## 2017-11-07 DIAGNOSIS — Z86018 Personal history of other benign neoplasm: Secondary | ICD-10-CM | POA: Diagnosis not present

## 2017-11-07 DIAGNOSIS — D485 Neoplasm of uncertain behavior of skin: Secondary | ICD-10-CM | POA: Diagnosis not present

## 2017-11-07 DIAGNOSIS — D2262 Melanocytic nevi of left upper limb, including shoulder: Secondary | ICD-10-CM | POA: Diagnosis not present

## 2017-11-07 DIAGNOSIS — D1800 Hemangioma unspecified site: Secondary | ICD-10-CM | POA: Diagnosis not present

## 2017-11-07 DIAGNOSIS — L814 Other melanin hyperpigmentation: Secondary | ICD-10-CM | POA: Diagnosis not present

## 2017-11-07 DIAGNOSIS — C44219 Basal cell carcinoma of skin of left ear and external auricular canal: Secondary | ICD-10-CM | POA: Diagnosis not present

## 2017-11-25 ENCOUNTER — Ambulatory Visit: Payer: BLUE CROSS/BLUE SHIELD | Admitting: Internal Medicine

## 2017-12-03 DIAGNOSIS — J209 Acute bronchitis, unspecified: Secondary | ICD-10-CM | POA: Diagnosis not present

## 2017-12-05 ENCOUNTER — Ambulatory Visit: Payer: BLUE CROSS/BLUE SHIELD | Admitting: Internal Medicine

## 2017-12-05 ENCOUNTER — Encounter: Payer: Self-pay | Admitting: Internal Medicine

## 2017-12-05 VITALS — BP 122/84 | HR 75 | Ht 67.0 in | Wt 175.0 lb

## 2017-12-05 DIAGNOSIS — R9431 Abnormal electrocardiogram [ECG] [EKG]: Secondary | ICD-10-CM | POA: Diagnosis not present

## 2017-12-05 DIAGNOSIS — R079 Chest pain, unspecified: Secondary | ICD-10-CM

## 2017-12-05 MED ORDER — PREDNISONE 50 MG PO TABS: ORAL_TABLET | ORAL | 0 refills | Status: DC

## 2017-12-05 MED ORDER — METOPROLOL TARTRATE 100 MG PO TABS: ORAL_TABLET | ORAL | 0 refills | Status: DC

## 2017-12-05 NOTE — Progress Notes (Signed)
OFFICE CONSULT NOTE  Chief Complaint:  Chest pain, cardiovascular risk assessment  Primary Care Physician: Velna Hatchet, MD  HPI:  Sean Pugh is a 62 y.o. male who is being seen today for the evaluation of chest pain at the request of Velna Hatchet, MD.  This is a very pleasant 62 year old male who works for [employer].  Previously had worked for [employer] and [employer].  He has traveled extensively around the world and lived in [place] for over a year.  Past medical history is significant for kidney stones, migraine headaches, sciatica and left shoulder repair.  No known heart disease, hypertension, diabetes or significant dyslipidemia.  He does not take regular medication other than aspirin and he uses a pro-air inhaler only with episodes of bronchitis.  Family history is not significant for early onset heart disease.  He reports good sleep at night with a low sleepiness score of 3.  He used to be more active prior to his orthopedic issues and has recently gained a little weight.  He is currently in the overweight category but has generally been normal weight most of his life.  Recently he has had some discomfort in the left chest.  He is not sure if this is related to stress or other life issues.  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  He describes it left chest pressure which is not necessarily worse with exertion or relieved by rest.  It comes on sporadically.  He notes it is been worse however recently.  PMHx:  Past Medical History:  Diagnosis Date  . Kidney stones   . Memory loss   . Migraine headache without aura   . Sciatica 11/10/2012   Left-sided sciatica  . Scoliosis   . Tremor     Past Surgical History:  Procedure Laterality Date  . HERNIA REPAIR    . KIDNEY STONE SURGERY    . SHOULDER SURGERY Right     FAMHx:  Family History  Problem Relation Age of Onset  . Cancer Father   . Stroke Paternal Grandmother   . Cancer Paternal  Grandfather   . Multiple sclerosis Cousin   . Diabetes Cousin   . Migraines Son     SOCHx:   reports that he has never smoked. He has never used smokeless tobacco. He reports that he drinks alcohol. He reports that he does not use drugs.  ALLERGIES:  Allergies  Allergen Reactions  . Ivp Dye [Iodinated Diagnostic Agents]     Swelling, rash  . Sulfa Antibiotics Rash    ROS: Pertinent items noted in HPI and remainder of comprehensive ROS otherwise negative.  HOME MEDS: Current Outpatient Medications on File Prior to Visit  Medication Sig Dispense Refill  . aspirin 81 MG tablet Take 81 mg by mouth daily.    . Famotidine-Ca Carb-Mag Hydrox (PEPCID COMPLETE PO) Take 1 tablet by mouth as needed.    Marland Kitchen ibuprofen (ADVIL,MOTRIN) 200 MG tablet Take by mouth every 6 (six) hours as needed. 1 -2 tabs prn headache.    . Multiple Vitamin (MULTIVITAMIN) tablet Take 1 tablet by mouth daily.    Marland Kitchen PROAIR HFA 108 (90 Base) MCG/ACT inhaler INHALE 1-2 PUFFS EVERY 6 HOURS AS NEEDED FOR SHORTNESS OF BREATH  2   No current facility-administered medications on file prior to visit.     LABS/IMAGING: No results found for this or any previous visit (from the past 48 hour(s)). No  results found.  LIPID PANEL: No results found for: CHOL, TRIG, HDL, CHOLHDL, VLDL, LDLCALC, LDLDIRECT  WEIGHTS: Wt Readings from Last 3 Encounters:  12/05/17 175 lb (79.4 kg)  11/14/15 179 lb 3.2 oz (81.3 kg)  09/12/15 181 lb 9.6 oz (82.4 kg)    VITALS: BP 122/84   Pulse 75   Ht 5\' 7"  (1.702 m)   Wt 175 lb (79.4 kg)   BMI 27.41 kg/m   EXAM: General appearance: alert and no distress Neck: no carotid bruit, no JVD and thyroid not enlarged, symmetric, no tenderness/mass/nodules Lungs: clear to auscultation bilaterally Heart: regular rate and rhythm, S1, S2 normal, no murmur, click, rub or gallop Abdomen: soft, non-tender; bowel sounds normal; no masses,  no organomegaly Extremities: extremities normal, atraumatic,  no cyanosis or edema Pulses: 2+ and symmetric Skin: Skin color, texture, turgor normal. No rashes or lesions Neurologic: Grossly normal Psych: Pleasant  EKG: Normal sinus rhythm 75, nonspecific T wave changes- personally reviewed  ASSESSMENT: 1. Atypical chest pressure 2. Abnormal EKG with nonspecific T wave changes  PLAN: 1.   Mr. Intriago is describing somewhat atypical chest pressure however he does have some abnormal T wave changes on his EKG which could be ischemic.  Although he has no significant family history of heart disease he is concerned about whether this could be due to coronary artery disease.  He is on aspirin but only for preventative nature.  I do not have labs available to me now, specifically a lipid profile to review however he is not on therapy and denies history of hypertension or diabetes.  I feel the best way to answer the question as to whether or not he has underlying coronary disease would be a CT coronary angiogram.  If this is negative it would portend a low risk prognosis and if there is no coronary artery calcification, argue that no further aggressive therapy would be necessary other than lifestyle and dietary modification.  Plan follow-up with me after his CT angiogram.  Thanks again for the kind referral.  Pixie Casino, MD, [facility], [place] Director of the Advanced Lipid Disorders &  Cardiovascular Risk Reduction Clinic Diplomate of the American Board of Clinical Lipidology Attending Cardiologist  Direct Dial: [phone]  Fax: [phone]  Website:  [email or web address] 12/05/2017, 3:33 PM

## 2017-12-05 NOTE — Patient Instructions (Addendum)
Please send a copy of lab results to fax: [phone] or send via MyChart  Dr. Debara Pickett has ordered a coronary CT angiogram with FFR. This is done at [place] will be called to schedule this test once it is approved with your insurance. Instructions are noted below.   Your physician recommends that you schedule a follow-up appointment in: 4-6 weeks (after test)   Please arrive at the [facility] main entrance of [facility] at ______ AM/PM (30-45 minutes prior to test start time)  [facility] [place], [place] [address] [phone]  Proceed to the [facility] Radiology Department (First Floor).  Please follow these instructions carefully (unless otherwise directed):  Hold all erectile dysfunction medications at least 48 hours prior to test.  Please have lab work (BMET) done 1 week prior to test.  On the Night Before the Test: . Be sure to Drink plenty of water. . Do not consume any caffeinated/decaffeinated beverages or chocolate 12 hours prior to your test. . Do not take any antihistamines 12 hours prior to your test. . If the patient has contrast allergy: ? Patient will need a prescription for Prednisone and very clear instructions (as follows): 1. Prednisone 50 mg - take 13 hours prior to test 2. Take another Prednisone 50 mg 7 hours prior to test 3. Take another Prednisone 50 mg 1 hour prior to test 4. Take Benadryl 50 mg 1 hour prior to test . Patient must complete all four doses of above prophylactic medications. . Patient will need a ride after test due to Benadryl.  On the Day of the Test: . Drink plenty of water. Do not drink any water within one hour of the test. . Do not eat any food 4 hours prior to the test. . You may take your regular medications prior to the test.  . Take metoprolol tartrate 100mg  (Lopressor) two hours prior to test.      After the Test: . Drink plenty of water. . After receiving IV contrast, you  may experience a mild flushed feeling. This is normal. . On occasion, you may experience a mild rash up to 24 hours after the test. This is not dangerous. If this occurs, you can take Benadryl 25 mg and increase your fluid intake. . If you experience trouble breathing, this can be serious. If it is severe call 911 IMMEDIATELY. If it is mild, please call our office. . If you take any of these medications: Glipizide/Metformin, Avandament, Glucavance, please do not take 48 hours after completing test.

## 2017-12-11 DIAGNOSIS — Z23 Encounter for immunization: Secondary | ICD-10-CM | POA: Diagnosis not present

## 2017-12-25 DIAGNOSIS — J189 Pneumonia, unspecified organism: Secondary | ICD-10-CM | POA: Diagnosis not present

## 2017-12-25 DIAGNOSIS — J45998 Other asthma: Secondary | ICD-10-CM | POA: Diagnosis not present

## 2017-12-25 DIAGNOSIS — Z8701 Personal history of pneumonia (recurrent): Secondary | ICD-10-CM | POA: Diagnosis not present

## 2017-12-25 DIAGNOSIS — M79601 Pain in right arm: Secondary | ICD-10-CM | POA: Diagnosis not present

## 2018-01-06 ENCOUNTER — Telehealth: Payer: Self-pay | Admitting: Internal Medicine

## 2018-01-06 MED ORDER — METOPROLOL TARTRATE 100 MG PO TABS: ORAL_TABLET | ORAL | 0 refills | Status: DC

## 2018-01-06 NOTE — Telephone Encounter (Signed)
  LVM for patient to call to set up appt with Dr Debara Pickett for first or second week in December as a follow up to his CT appt

## 2018-01-06 NOTE — Telephone Encounter (Signed)
From: Howie Ill  Sent: 01/03/2018  4:16 PM EDT  To: Cv Div Nl Triage  Subject: cardiac ct                    Patient will need medicaton called back in , pharmacy cancelled it - he has iv contrast allergy as well  Patient is scheduled for November 25,2019 830a

## 2018-01-06 NOTE — Telephone Encounter (Signed)
Rx for metoprolol tartrate re-sent to pharmacy. Prednisone was sent on 10/3. Patient notified via MyChart and instructions sent as well

## 2018-01-17 ENCOUNTER — Ambulatory Visit (INDEPENDENT_AMBULATORY_CARE_PROVIDER_SITE_OTHER): Payer: BLUE CROSS/BLUE SHIELD | Admitting: Orthopedic Surgery

## 2018-01-17 ENCOUNTER — Encounter (INDEPENDENT_AMBULATORY_CARE_PROVIDER_SITE_OTHER): Payer: Self-pay | Admitting: Orthopedic Surgery

## 2018-01-17 DIAGNOSIS — M25521 Pain in right elbow: Secondary | ICD-10-CM

## 2018-01-17 NOTE — Progress Notes (Signed)
Office Visit Note   Patient: Sean Pugh           Date of Birth: Nov 13, 1955           MRN: [id number] Visit Date: 01/17/2018 Requested by: Velna Hatchet, MD [address] [place], [place] [address] PCP: Velna Hatchet, MD  Subjective: Chief Complaint  Patient presents with  . Arm Pain    HPI: Sean Pugh is a patient with right elbow pain.  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  He felt a pop in the lateral elbow region and his arm and some numbness at that time.  He describes mild continuing pain from the elbow to the mid forearm level since that time but it is improving.  He is right-hand dominant.              ROS: All systems reviewed are negative as they relate to the chief complaint within the history of present illness.  Patient denies  fevers or chills.   Assessment & Plan: Visit Diagnoses:  1. Pain in right elbow     Plan: Impression is primarily lateral sided right elbow pain with history of injury and swelling but with some resolution since the initial incident.  His biceps tendon is functional and intact with equal supination strength in both arms.  Triceps is also functional and intact.  He does not really have much in the way of pain around the lateral or medial epicondyle region.  I think is likely represents a partial tear of the common extensor origin which has since healed and  is becoming less symptomatic.  Plan at this time is repeat clinical evaluation in about 8 weeks.  He is wearing a tennis elbow strap which is helping.  No real functional weakness or evidence of injury to the radial or posterior interosseous nerve today.  Follow-Up Instructions: Return in about 8 weeks (around 03/14/2018).   Orders:  No orders of the defined types were placed in this encounter.  No orders of the defined types were placed in this encounter.     Procedures: No procedures performed   Clinical Data: No  additional findings.  Objective: Vital Signs: There were no vitals taken for this visit.  Physical Exam:   Constitutional: Patient appears well-developed HEENT:  Head: Normocephalic Eyes:EOM are normal Neck: Normal range of motion Cardiovascular: Normal rate Pulmonary/chest: Effort normal Neurologic: Patient is alert Skin: Skin is warm Psychiatric: Patient has normal mood and affect    Ortho Exam: Ortho exam demonstrates old range of motion of the right elbow.  Pronation supination strength is symmetric at 5+ out of 5 bilaterally.  No real pain with wrist or finger extension on the right-hand side.  Negative Tinel's cubital tunnel with no subluxation of the ulnar nerve.  Shoulder and wrist range of motion intact on the right.  No other palpable defects or areas of discrete tenderness in the right elbow region.  Does not have much pain to palpation over the supinator arcade.  Specialty Comments:  No specialty comments available.  Imaging: No results found.   PMFS History: Patient Active Problem List   Diagnosis Date Noted  . Chest pain 12/05/2017  . Abnormal EKG 12/05/2017  . Sciatica 11/10/2012  . Memory loss 11/05/2012  . Essential and other specified forms of tremor 11/05/2012  . Migraine without aura 11/05/2012   Past Medical History:  Diagnosis Date  . Kidney stones   .  Memory loss   . Migraine headache without aura   . Sciatica 11/10/2012   Left-sided sciatica  . Scoliosis   . Tremor     Family History  Problem Relation Age of Onset  . Cancer Father   . Stroke Paternal Grandmother   . Cancer Paternal Grandfather   . Multiple sclerosis Cousin   . Diabetes Cousin   . Migraines Son     Past Surgical History:  Procedure Laterality Date  . HERNIA REPAIR    . KIDNEY STONE SURGERY    . SHOULDER SURGERY Right    Social History   Occupational History  . Not on file  Tobacco Use  . Smoking status: Never Smoker  . Smokeless tobacco: Never Used  Substance  and Sexual Activity  . Alcohol use: Yes    Comment: occasional  . Drug use: No  . Sexual activity: Not on file

## 2018-01-23 DIAGNOSIS — C44219 Basal cell carcinoma of skin of left ear and external auricular canal: Secondary | ICD-10-CM | POA: Diagnosis not present

## 2018-01-27 ENCOUNTER — Ambulatory Visit (HOSPITAL_COMMUNITY): Payer: BLUE CROSS/BLUE SHIELD

## 2018-02-13 ENCOUNTER — Ambulatory Visit: Payer: BLUE CROSS/BLUE SHIELD | Admitting: Internal Medicine

## 2018-03-20 ENCOUNTER — Ambulatory Visit: Payer: BLUE CROSS/BLUE SHIELD | Admitting: Internal Medicine

## 2018-03-20 ENCOUNTER — Telehealth: Payer: Self-pay | Admitting: *Deleted

## 2018-03-20 ENCOUNTER — Encounter: Payer: Self-pay | Admitting: Internal Medicine

## 2018-03-20 VITALS — BP 130/72 | HR 86 | Ht 67.0 in | Wt 184.2 lb

## 2018-03-20 DIAGNOSIS — R079 Chest pain, unspecified: Secondary | ICD-10-CM

## 2018-03-20 DIAGNOSIS — R9431 Abnormal electrocardiogram [ECG] [EKG]: Secondary | ICD-10-CM

## 2018-03-20 DIAGNOSIS — R0789 Other chest pain: Secondary | ICD-10-CM

## 2018-03-20 DIAGNOSIS — Z136 Encounter for screening for cardiovascular disorders: Secondary | ICD-10-CM

## 2018-03-20 NOTE — Telephone Encounter (Signed)
Left message for patient to call regarding appointment for Calcium Score testing ordered by Dr. Debara Pickett

## 2018-03-20 NOTE — Patient Instructions (Signed)
Medication Instructions:  Continue current medications If you need a refill on your cardiac medications before your next appointment, please call your pharmacy.   Testing/Procedures: Dr. Debara Pickett has ordered a VASCUSCREEN - carotid doppler ONLY. This is $50 out of pocket.   Dr. Debara Pickett has ordered a CT coronary calcium score. This test is done at [address]rd [address]. This is $150 out of pocket.   Coronary CalciumScan A coronary calcium scan is an imaging test used to look for deposits of calcium and other fatty materials (plaques) in the inner lining of the blood vessels of the heart (coronary arteries). These deposits of calcium and plaques can partly clog and narrow the coronary arteries without producing any symptoms or warning signs. This puts a person at risk for a heart attack. This test can detect these deposits before symptoms develop. Tell a health care provider about:  Any allergies you have.  All medicines you are taking, including vitamins, herbs, eye drops, creams, and over-the-counter medicines.  Any problems you or family members have had with anesthetic medicines.  Any blood disorders you have.  Any surgeries you have had.  Any medical conditions you have.  Whether you are pregnant or may be pregnant. What are the risks? Generally, this is a safe procedure. However, problems may occur, including:  Harm to a pregnant woman and her unborn baby. This test involves the use of radiation. Radiation exposure can be dangerous to a pregnant woman and her unborn baby. If you are pregnant, you generally should not have this procedure done.  Slight increase in the risk of cancer. This is because of the radiation involved in the test. What happens before the procedure? No preparation is needed for this procedure. What happens during the procedure?  You will undress and remove any jewelry around your neck or chest.  You will put on a hospital gown.  Sticky electrodes  will be placed on your chest. The electrodes will be connected to an electrocardiogram (ECG) machine to record a tracing of the electrical activity of your heart.  A CT scanner will take pictures of your heart. During this time, you will be asked to lie still and hold your breath for 2-3 seconds while a picture of your heart is being taken. The procedure may vary among health care providers and hospitals. What happens after the procedure?  You can get dressed.  You can return to your normal activities.  It is up to you to get the results of your test. Ask your health care provider, or the department that is doing the test, when your results will be ready. Summary  A coronary calcium scan is an imaging test used to look for deposits of calcium and other fatty materials (plaques) in the inner lining of the blood vessels of the heart (coronary arteries).  Generally, this is a safe procedure. Tell your health care provider if you are pregnant or may be pregnant.  No preparation is needed for this procedure.  A CT scanner will take pictures of your heart.  You can return to your normal activities after the scan is done. This information is not intended to replace advice given to you by your health care provider. Make sure you discuss any questions you have with your health care provider. Document Released: 08/18/2007 Document Revised: 01/09/2016 Document Reviewed: 01/09/2016 Elsevier Interactive Patient Education  2017 [place]: At [facility], you and your health needs are our priority.  As part of our continuing mission to provide you with exceptional heart care, we have created designated Provider Care Teams.  These Care Teams include your primary Cardiologist (physician) and Advanced Practice Providers (APPs -  Physician Assistants and Nurse Practitioners) who all work together to provide you with the care you need, when you need it. You will need a follow up  appointment in 4-6 weeks after your testing is completed. You may see Dr. Debara Pickett or one of the following Advanced Practice Providers on your designated Care Team: Almyra Deforest, [place] . Fabian Sharp, PA-C  Any Other Special Instructions Will Be Listed Below (If Applicable).

## 2018-03-20 NOTE — Progress Notes (Signed)
OFFICE CONSULT NOTE  Chief Complaint:  Chest pain, cardiovascular risk assessment  Primary Care Physician: Velna Hatchet, MD  HPI:  Sean Pugh is a 63 y.o. male who is being seen today for the evaluation of chest pain at the request of Velna Hatchet, MD.  This is a very pleasant 63 year old male who works for [employer].  Previously had worked for [employer] and [place].  He has traveled extensively around the world and lived in [place] for over a year.  Past medical history is significant for kidney stones, migraine headaches, sciatica and left shoulder repair.  No known heart disease, hypertension, diabetes or significant dyslipidemia.  He does not take regular medication other than aspirin and he uses a pro-air inhaler only with episodes of bronchitis.  Family history is not significant for early onset heart disease.  He reports good sleep at night with a low sleepiness score of 3.  He used to be more active prior to his orthopedic issues and has recently gained a little weight.  He is currently in the overweight category but has generally been normal weight most of his life.  Recently he has had some discomfort in the left chest.  He is not sure if this is related to stress or other life issues.  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  He describes it left chest pressure which is not necessarily worse with exertion or relieved by rest.  It comes on sporadically.  He notes it is been worse however recently.  03/20/2017  Sean Pugh returns today for follow-up.  Unfortunately was unable to get the CT coronary angiogram due to cost issues.  His co-pay cost would have been $1200.  He does report that his chest discomfort has resolved.  He still interested in cardiovascular risk reduction and whether or not he has any early onset heart disease.  At this point I offered a couple of other options.  He is interested in a vascular screening carotid ultrasound to look for  any early onset carotid disease and I offered a CT calcium score.  This is an inexpensive test to look for evidence of early onset coronary disease.  PMHx:  Past Medical History:  Diagnosis Date  . Kidney stones   . Memory loss   . Migraine headache without aura   . Sciatica 11/10/2012   Left-sided sciatica  . Scoliosis   . Tremor     Past Surgical History:  Procedure Laterality Date  . HERNIA REPAIR    . KIDNEY STONE SURGERY    . SHOULDER SURGERY Right     FAMHx:  Family History  Problem Relation Age of Onset  . Cancer Father   . Stroke Paternal Grandmother   . Cancer Paternal Grandfather   . Multiple sclerosis Cousin   . Diabetes Cousin   . Migraines Son     SOCHx:   reports that he has never smoked. He has never used smokeless tobacco. He reports current alcohol use. He reports that he does not use drugs.  ALLERGIES:  Allergies  Allergen Reactions  . Ivp Dye [Iodinated Diagnostic Agents]     Swelling, rash  . Sulfa Antibiotics Rash    ROS: Pertinent items noted in HPI and remainder of comprehensive ROS otherwise negative.  HOME MEDS: Current Outpatient Medications on File Prior to Visit  Medication Sig Dispense Refill  . aspirin 81 MG tablet Take 81 mg by mouth daily.    Marland Kitchen  ibuprofen (ADVIL,MOTRIN) 200 MG tablet Take by mouth every 6 (six) hours as needed. 1 -2 tabs prn headache.    . Multiple Vitamin (MULTIVITAMIN) tablet Take 1 tablet by mouth daily.    Marland Kitchen PROAIR HFA 108 (90 Base) MCG/ACT inhaler INHALE 1-2 PUFFS EVERY 6 HOURS AS NEEDED FOR SHORTNESS OF BREATH  2   No current facility-administered medications on file prior to visit.     LABS/IMAGING: No results found for this or any previous visit (from the past 48 hour(s)). No results found.  LIPID PANEL: No results found for: CHOL, TRIG, HDL, CHOLHDL, VLDL, LDLCALC, LDLDIRECT  WEIGHTS: Wt Readings from Last 3 Encounters:  03/20/18 184 lb 3.2 oz (83.6 kg)  12/05/17 175 lb (79.4 kg)  11/14/15 179  lb 3.2 oz (81.3 kg)    VITALS: BP 130/72   Pulse 86   Ht 5\' 7"  (1.702 m)   Wt 184 lb 3.2 oz (83.6 kg)   BMI 28.85 kg/m   EXAM: Deferred  EKG: Deferred   ASSESSMENT: 1. Atypical chest pressure - resolved 2. Abnormal EKG with nonspecific T wave changes  PLAN: 1.   Sean Pugh reports resolution of his chest pressure.  He did have some nonspecific T wave changes on EKG.  He still curious as to whether or not there is any underlying coronary disease and I recommend coronary artery calcium scoring.  His last LDL was 122 earlier this year and he may need to work on more aggressive dietary intervention or possibly we may need to treat him with statin therapy.  Finally he is interested in a screening carotid ultrasound.  This would be helpful to determine if there is any evidence of ASCVD.  Plan follow-up with me afterwards.  Pixie Casino, MD, [facility], [place] Director of the Advanced Lipid Disorders &  Cardiovascular Risk Reduction Clinic Diplomate of the American Board of Clinical Lipidology Attending Cardiologist  Direct Dial: [phone]  Fax: [phone]  Website:  [email or web address] 03/20/2018, 8:10 AM

## 2018-03-21 ENCOUNTER — Encounter (INDEPENDENT_AMBULATORY_CARE_PROVIDER_SITE_OTHER): Payer: Self-pay | Admitting: Orthopedic Surgery

## 2018-03-21 ENCOUNTER — Ambulatory Visit (INDEPENDENT_AMBULATORY_CARE_PROVIDER_SITE_OTHER): Payer: BLUE CROSS/BLUE SHIELD | Admitting: Orthopedic Surgery

## 2018-03-21 DIAGNOSIS — M25521 Pain in right elbow: Secondary | ICD-10-CM | POA: Diagnosis not present

## 2018-03-21 NOTE — Progress Notes (Signed)
Office Visit Note   Patient: Sean Pugh           Date of Birth: [birth year]           MRN: [id number] Visit Date: 03/21/2018 Requested by: Velna Hatchet, MD [address] [place], [place] [address] PCP: Velna Hatchet, MD  Subjective: Chief Complaint  Patient presents with  . Right Elbow - Pain    HPI: Sheri is a patient with right arm pain.  Here for 72-month follow-up.  Was helping someone change a tire and felt a pop in the lateral elbow.  In general he is doing better.  He has been using the tennis elbow strap.  Not taking any medication.  Still has some lateral pain but is not terrible.              ROS: All systems reviewed are negative as they relate to the chief complaint within the history of present illness.  Patient denies  fevers or chills.   Assessment & Plan: Visit Diagnoses: No diagnosis found.  Plan: Impression is resolving lateral elbow pain right side following injury.  Today he has excellent strength and only mild pain with gripping.  He wants to resume some of his normal exercise routine which we discussed.  Platelet rich plasma would be the next indicated intervention if his symptoms return.  I did show him how to do some eccentric strengthening of that lateral extensor compartment.  I will see him back as needed  Follow-Up Instructions: No follow-ups on file.   Orders:  No orders of the defined types were placed in this encounter.  No orders of the defined types were placed in this encounter.     Procedures: No procedures performed   Clinical Data: No additional findings.  Objective: Vital Signs: There were no vitals taken for this visit.  Physical Exam:   Constitutional: Patient appears well-developed HEENT:  Head: Normocephalic Eyes:EOM are normal Neck: Normal range of motion Cardiovascular: Normal rate Pulmonary/chest: Effort normal Neurologic: Patient is alert Skin: Skin is warm Psychiatric: Patient has normal mood and  affect    Ortho Exam: Ortho exam demonstrates full cervical spine range of motion.  He has full elbow range of motion on the right with only mild tenderness at the lateral epicondyle.  Supination strength is equal and there is no tenderness over the biceps tendon.  No tenderness over the medial epicondyle.  No real pain with finger or wrist extension.  No tenderness in the radial tunnel.  Specialty Comments:  No specialty comments available.  Imaging: No results found.   PMFS History: Patient Active Problem List   Diagnosis Date Noted  . Chest pain 12/05/2017  . Abnormal EKG 12/05/2017  . Sciatica 11/10/2012  . Memory loss 11/05/2012  . Essential and other specified forms of tremor 11/05/2012  . Migraine without aura 11/05/2012   Past Medical History:  Diagnosis Date  . Kidney stones   . Memory loss   . Migraine headache without aura   . Sciatica 11/10/2012   Left-sided sciatica  . Scoliosis   . Tremor     Family History  Problem Relation Age of Onset  . Cancer Father   . Stroke Paternal Grandmother   . Cancer Paternal Grandfather   . Multiple sclerosis Cousin   . Diabetes Cousin   . Migraines Son     Past Surgical History:  Procedure Laterality Date  . HERNIA REPAIR    . KIDNEY STONE SURGERY    .  SHOULDER SURGERY Right    Social History   Occupational History  . Not on file  Tobacco Use  . Smoking status: Never Smoker  . Smokeless tobacco: Never Used  Substance and Sexual Activity  . Alcohol use: Yes    Comment: occasional  . Drug use: No  . Sexual activity: Not on file

## 2018-03-31 ENCOUNTER — Ambulatory Visit (HOSPITAL_COMMUNITY): Payer: BLUE CROSS/BLUE SHIELD

## 2018-04-01 ENCOUNTER — Other Ambulatory Visit: Payer: BLUE CROSS/BLUE SHIELD

## 2018-04-03 ENCOUNTER — Ambulatory Visit (HOSPITAL_COMMUNITY)
Admission: RE | Admit: 2018-04-03 | Discharge: 2018-04-03 | Disposition: A | Discharge status: Home / Self Care | Payer: BLUE CROSS/BLUE SHIELD | Source: Ambulatory Visit | Attending: Cardiology | Admitting: Cardiology

## 2018-04-03 DIAGNOSIS — Z136 Encounter for screening for cardiovascular disorders: Secondary | ICD-10-CM

## 2018-04-14 ENCOUNTER — Inpatient Hospital Stay: Admission: RE | Admit: 2018-04-14 | Payer: BLUE CROSS/BLUE SHIELD | Source: Ambulatory Visit

## 2018-05-20 ENCOUNTER — Ambulatory Visit: Payer: BLUE CROSS/BLUE SHIELD | Admitting: Internal Medicine

## 2018-05-20 ENCOUNTER — Encounter: Payer: Self-pay | Admitting: Internal Medicine

## 2018-05-20 ENCOUNTER — Other Ambulatory Visit: Payer: Self-pay

## 2018-05-20 VITALS — BP 126/80 | HR 94 | Ht 68.0 in | Wt 180.0 lb

## 2018-05-20 DIAGNOSIS — R9431 Abnormal electrocardiogram [ECG] [EKG]: Secondary | ICD-10-CM | POA: Diagnosis not present

## 2018-05-20 DIAGNOSIS — R079 Chest pain, unspecified: Secondary | ICD-10-CM

## 2018-05-20 NOTE — Progress Notes (Signed)
OFFICE CONSULT NOTE  Chief Complaint:  Follow-up studies  Primary Care Physician: Velna Hatchet, MD  HPI:  Sean Pugh is a 63 y.o. male who is being seen today for the evaluation of chest pain at the request of Velna Hatchet, MD.  This is a very pleasant 63 year old male who works for [employer].  Previously had worked for [employer] and [place].  He has traveled extensively around the world and lived in [place] for over a year.  Past medical history is significant for kidney stones, migraine headaches, sciatica and left shoulder repair.  No known heart disease, hypertension, diabetes or significant dyslipidemia.  He does not take regular medication other than aspirin and he uses a pro-air inhaler only with episodes of bronchitis.  Family history is not significant for early onset heart disease.  He reports good sleep at night with a low sleepiness score of 3.  He used to be more active prior to his orthopedic issues and has recently gained a little weight.  He is currently in the overweight category but has generally been normal weight most of his life.  Recently he has had some discomfort in the left chest.  He is not sure if this is related to stress or other life issues.  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  He describes it left chest pressure which is not necessarily worse with exertion or relieved by rest.  It comes on sporadically.  He notes it is been worse however recently.  03/20/2017  Mr. Kinnan returns today for follow-up.  Unfortunately was unable to get the CT coronary angiogram due to cost issues.  His co-pay cost would have been $1200.  He does report that his chest discomfort has resolved.  He still interested in cardiovascular risk reduction and whether or not he has any early onset heart disease.  At this point I offered a couple of other options.  He is interested in a vascular screening carotid ultrasound to look for any early onset carotid  disease and I offered a CT calcium score.  This is an inexpensive test to look for evidence of early onset coronary disease.  05/20/2018  Mr. Geurts seen today for follow-up.  He did undergo screening carotid Dopplers which showed a speck of atherosclerosis in the right carotid bulb otherwise no significant carotid stenosis.  He did not have any screening CT of his coronaries however I suspect his risk is quite low.  Based on his LDL findings, would recommend diet and exercise along with lifestyle changes.  PMHx:  Past Medical History:  Diagnosis Date  . Kidney stones   . Memory loss   . Migraine headache without aura   . Sciatica 11/10/2012   Left-sided sciatica  . Scoliosis   . Tremor     Past Surgical History:  Procedure Laterality Date  . HERNIA REPAIR    . KIDNEY STONE SURGERY    . SHOULDER SURGERY Right     FAMHx:  Family History  Problem Relation Age of Onset  . Cancer Father   . Stroke Paternal Grandmother   . Cancer Paternal Grandfather   . Multiple sclerosis Cousin   . Diabetes Cousin   . Migraines Son     SOCHx:   reports that he has never smoked. He has never used smokeless tobacco. He reports current alcohol use. He reports that he does not use drugs.  ALLERGIES:  Allergies  Allergen  Reactions  . Ivp Dye [Iodinated Diagnostic Agents]     Swelling, rash  . Sulfa Antibiotics Rash    ROS: Pertinent items noted in HPI and remainder of comprehensive ROS otherwise negative.  HOME MEDS: Current Outpatient Medications on File Prior to Visit  Medication Sig Dispense Refill  . aspirin 81 MG tablet Take 81 mg by mouth daily.    Marland Kitchen ibuprofen (ADVIL,MOTRIN) 200 MG tablet Take by mouth every 6 (six) hours as needed. 1 -2 tabs prn headache.    . Multiple Vitamin (MULTIVITAMIN) tablet Take 1 tablet by mouth daily.    Marland Kitchen PROAIR HFA 108 (90 Base) MCG/ACT inhaler INHALE 1-2 PUFFS EVERY 6 HOURS AS NEEDED FOR SHORTNESS OF BREATH  2   No current facility-administered  medications on file prior to visit.     LABS/IMAGING: No results found for this or any previous visit (from the past 48 hour(s)). No results found.  LIPID PANEL: No results found for: CHOL, TRIG, HDL, CHOLHDL, VLDL, LDLCALC, LDLDIRECT  WEIGHTS: Wt Readings from Last 3 Encounters:  05/20/18 180 lb (81.6 kg)  03/20/18 184 lb 3.2 oz (83.6 kg)  12/05/17 175 lb (79.4 kg)    VITALS: BP 126/80   Pulse 94   Ht 5\' 8"  (1.727 m)   Wt 180 lb (81.6 kg)   SpO2 96%   BMI 27.37 kg/m   EXAM: Deferred  EKG: Deferred   ASSESSMENT: 1. Atypical chest pressure - resolved 2. Abnormal EKG with nonspecific T wave changes  PLAN: 1.   Mr. Bucklew had barely detectable atherosclerosis in the right carotid bulb.  There was no evidence of any significant stenosis.  He has had no further chest pressure.  I would recommend aggressive diet and lifestyle changes at this point.   Plan follow-up with me as needed.  Pixie Casino, MD, [facility], [place] Director of the Advanced Lipid Disorders &  Cardiovascular Risk Reduction Clinic Diplomate of the American Board of Clinical Lipidology Attending Cardiologist  Direct Dial: [phone]  Fax: [phone]  Website:  [email or web address] 05/20/2018, 8:23 AM

## 2018-05-20 NOTE — Patient Instructions (Signed)
Medication Instructions:  NOT NEEDED If you need a refill on your cardiac medications before your next appointment, please call your pharmacy.   Lab work: NOT NEEDED If you have labs (blood work) drawn today and your tests are completely normal, you will receive your results only by: Marland Kitchen MyChart Message (if you have MyChart) OR . A paper copy in the mail If you have any lab test that is abnormal or we need to change your treatment, we will call you to review the results.  Testing/Procedures: NOT NEEDED  Follow-Up: At [facility], you and your health needs are our priority.  As part of our continuing mission to provide you with exceptional heart care, we have created designated Provider Care Teams.  These Care Teams include your primary Cardiologist (physician) and Advanced Practice Providers (APPs -  Physician Assistants and Nurse Practitioners) who all work together to provide you with the care you need, when you need it. . Your physician recommends that you schedule a follow-up appointment on an as needed basis .   Any Other Special Instructions Will Be Listed Below (If Applicable).

## 2018-06-12 DIAGNOSIS — L820 Inflamed seborrheic keratosis: Secondary | ICD-10-CM | POA: Diagnosis not present

## 2018-09-03 DIAGNOSIS — J310 Chronic rhinitis: Secondary | ICD-10-CM | POA: Diagnosis not present

## 2018-09-03 DIAGNOSIS — R430 Anosmia: Secondary | ICD-10-CM | POA: Diagnosis not present

## 2018-09-03 DIAGNOSIS — J342 Deviated nasal septum: Secondary | ICD-10-CM | POA: Diagnosis not present

## 2018-09-03 DIAGNOSIS — J343 Hypertrophy of nasal turbinates: Secondary | ICD-10-CM | POA: Diagnosis not present

## 2018-10-03 DIAGNOSIS — Z20828 Contact with and (suspected) exposure to other viral communicable diseases: Secondary | ICD-10-CM | POA: Diagnosis not present

## 2018-10-13 DIAGNOSIS — G4700 Insomnia, unspecified: Secondary | ICD-10-CM | POA: Diagnosis not present

## 2018-11-04 DIAGNOSIS — Z1331 Encounter for screening for depression: Secondary | ICD-10-CM | POA: Diagnosis not present

## 2018-11-04 DIAGNOSIS — Z0000 Encounter for general adult medical examination without abnormal findings: Secondary | ICD-10-CM | POA: Diagnosis not present

## 2018-11-04 DIAGNOSIS — M419 Scoliosis, unspecified: Secondary | ICD-10-CM | POA: Diagnosis not present

## 2018-11-04 DIAGNOSIS — K219 Gastro-esophageal reflux disease without esophagitis: Secondary | ICD-10-CM | POA: Diagnosis not present

## 2018-11-04 DIAGNOSIS — G4700 Insomnia, unspecified: Secondary | ICD-10-CM | POA: Diagnosis not present

## 2018-11-04 DIAGNOSIS — J45998 Other asthma: Secondary | ICD-10-CM | POA: Diagnosis not present

## 2018-11-20 DIAGNOSIS — Z85828 Personal history of other malignant neoplasm of skin: Secondary | ICD-10-CM | POA: Diagnosis not present

## 2018-11-20 DIAGNOSIS — L570 Actinic keratosis: Secondary | ICD-10-CM | POA: Diagnosis not present

## 2018-11-20 DIAGNOSIS — D225 Melanocytic nevi of trunk: Secondary | ICD-10-CM | POA: Diagnosis not present

## 2018-11-20 DIAGNOSIS — Z86018 Personal history of other benign neoplasm: Secondary | ICD-10-CM | POA: Diagnosis not present

## 2018-11-20 DIAGNOSIS — L821 Other seborrheic keratosis: Secondary | ICD-10-CM | POA: Diagnosis not present

## 2018-11-28 DIAGNOSIS — J310 Chronic rhinitis: Secondary | ICD-10-CM | POA: Diagnosis not present

## 2018-11-28 DIAGNOSIS — J342 Deviated nasal septum: Secondary | ICD-10-CM | POA: Diagnosis not present

## 2018-11-28 DIAGNOSIS — R430 Anosmia: Secondary | ICD-10-CM | POA: Diagnosis not present

## 2018-11-28 DIAGNOSIS — J343 Hypertrophy of nasal turbinates: Secondary | ICD-10-CM | POA: Diagnosis not present

## 2019-01-09 DIAGNOSIS — Z23 Encounter for immunization: Secondary | ICD-10-CM | POA: Diagnosis not present

## 2019-01-13 ENCOUNTER — Ambulatory Visit: Payer: BLUE CROSS/BLUE SHIELD | Admitting: Neurology

## 2019-01-14 ENCOUNTER — Encounter: Payer: Self-pay | Admitting: Neurology

## 2019-01-14 ENCOUNTER — Other Ambulatory Visit: Payer: Self-pay

## 2019-01-14 ENCOUNTER — Ambulatory Visit: Payer: BC Managed Care – PPO | Admitting: Neurology

## 2019-01-14 VITALS — BP 140/82 | HR 97 | Temp 96.8°F | Ht 67.0 in | Wt 178.0 lb

## 2019-01-14 DIAGNOSIS — G43009 Migraine without aura, not intractable, without status migrainosus: Secondary | ICD-10-CM

## 2019-01-14 NOTE — Progress Notes (Signed)
Reason for visit: Migraine headache  Sean Pugh is an 63 y.o. male  History of present illness:  Mr. Stutzman is a 63 year old right-handed white male with a history of migraine headaches that have been well controlled simply by taking Advil prophylactically when the headaches begin.  He has not had a significant headache in over a year and a half.  He currently is working from home, he has been under a lot of stress recently.  He does continue to report some word finding problems and some mild memory issues, this has been present since at least 2014 when he was initially was seen through this office.  He has not had any alterations in his activities of daily living, he is still able to manage his medications, appointments, finances, and to operate a motor vehicle without difficulty.  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  The patient returns for an evaluation.  Past Medical History:  Diagnosis Date  . Basal cell carcinoma    Skin   . Kidney stones   . Memory loss   . Migraine headache without aura   . Sciatica 11/10/2012   Left-sided sciatica  . Scoliosis   . Tremor     Past Surgical History:  Procedure Laterality Date  . HERNIA REPAIR    . KIDNEY STONE SURGERY    . SHOULDER SURGERY Right     Family History  Problem Relation Age of Onset  . Cancer Father   . Stroke Paternal Grandmother   . Cancer Paternal Grandfather   . Multiple sclerosis Cousin   . Diabetes Cousin   . Migraines Son     Social history:  reports that he has never smoked. He has never used smokeless tobacco. He reports current alcohol use. He reports that he does not use drugs.    Allergies  Allergen Reactions  . Ivp Dye [Iodinated Diagnostic Agents]     Swelling, rash  . Sulfa Antibiotics Rash    Medications:  Prior to Admission medications   Medication Sig Start Date End Date Taking? Authorizing Provider  aspirin 81 MG tablet Take 81 mg by mouth daily.   Yes  [provider]  ibuprofen (ADVIL,MOTRIN) 200 MG tablet Take by mouth every 6 (six) hours as needed. 1 -2 tabs prn headache.   Yes [provider]  Multiple Vitamin (MULTIVITAMIN) tablet Take 1 tablet by mouth daily.   Yes [provider]  PROAIR HFA 108 (90 Base) MCG/ACT inhaler INHALE 1-2 PUFFS EVERY 6 HOURS AS NEEDED FOR SHORTNESS OF BREATH 10/21/17   [provider]    ROS:  Out of a complete 14 system review of symptoms, the patient complains only of the following symptoms, and all other reviewed systems are negative.  Headache  Blood pressure 140/82, pulse 97, temperature (!) 96.8 F (36 C), temperature source Temporal, height 5\' 7"  (1.702 m), weight 178 lb (80.7 kg), SpO2 96 %.  Physical Exam  General: The patient is alert and cooperative at the time of the examination.  Skin: No significant peripheral edema is noted.   Neurologic Exam  Mental status: The patient is alert and oriented x 3 at the time of the examination. The patient has apparent normal recent and remote memory, with an apparently normal attention span and concentration ability.   Cranial nerves: Facial symmetry is present. Speech is normal, no aphasia or dysarthria is noted. Extraocular movements are full. Visual fields are full.  Motor: The patient  has good strength in all 4 extremities.  Sensory examination: Soft touch sensation is symmetric on the face, arms, and legs.  Coordination: The patient has good finger-nose-finger and heel-to-shin bilaterally.  Gait and station: The patient has a normal gait. Tandem gait is normal. Romberg is negative. No drift is seen.  Reflexes: Deep tendon reflexes are symmetric.   Assessment/Plan:  1.  Migraine headache, well controlled  2.  Mild memory changes, word finding problems  The patient overall is doing quite well, we are not prescribing any medications for this patient currently, we will have him follow-up through this  office on an as-needed basis.  He will contact us if he has any concerns.  Jill Alexanders MD 01/14/2019 9:23 AM  [facility] [address] [place], [place] [address]  Phone [phone] Fax [phone]

## 2019-06-16 DIAGNOSIS — R109 Unspecified abdominal pain: Secondary | ICD-10-CM | POA: Diagnosis not present

## 2019-06-16 DIAGNOSIS — R5383 Other fatigue: Secondary | ICD-10-CM | POA: Diagnosis not present

## 2019-06-16 DIAGNOSIS — Z20828 Contact with and (suspected) exposure to other viral communicable diseases: Secondary | ICD-10-CM | POA: Diagnosis not present

## 2019-06-16 DIAGNOSIS — R197 Diarrhea, unspecified: Secondary | ICD-10-CM | POA: Diagnosis not present

## 2019-07-16 DIAGNOSIS — J309 Allergic rhinitis, unspecified: Secondary | ICD-10-CM | POA: Diagnosis not present

## 2019-07-16 DIAGNOSIS — K219 Gastro-esophageal reflux disease without esophagitis: Secondary | ICD-10-CM | POA: Diagnosis not present

## 2019-07-16 DIAGNOSIS — R05 Cough: Secondary | ICD-10-CM | POA: Diagnosis not present

## 2019-07-16 DIAGNOSIS — J45998 Other asthma: Secondary | ICD-10-CM | POA: Diagnosis not present

## 2019-08-11 ENCOUNTER — Other Ambulatory Visit (HOSPITAL_COMMUNITY): Payer: Self-pay | Admitting: Internal Medicine

## 2019-08-11 ENCOUNTER — Other Ambulatory Visit: Payer: Self-pay | Admitting: Internal Medicine

## 2019-08-11 ENCOUNTER — Ambulatory Visit (HOSPITAL_COMMUNITY)
Admission: RE | Admit: 2019-08-11 | Discharge: 2019-08-11 | Disposition: A | Discharge status: Home / Self Care | Payer: BC Managed Care – PPO | Source: Ambulatory Visit | Attending: Internal Medicine | Admitting: Internal Medicine

## 2019-08-11 DIAGNOSIS — R42 Dizziness and giddiness: Secondary | ICD-10-CM | POA: Diagnosis not present

## 2019-08-11 DIAGNOSIS — Z6828 Body mass index (BMI) 28.0-28.9, adult: Secondary | ICD-10-CM | POA: Diagnosis not present

## 2019-08-11 DIAGNOSIS — R202 Paresthesia of skin: Secondary | ICD-10-CM | POA: Diagnosis not present

## 2019-08-11 DIAGNOSIS — R200 Anesthesia of skin: Secondary | ICD-10-CM

## 2019-08-11 DIAGNOSIS — Z79899 Other long term (current) drug therapy: Secondary | ICD-10-CM | POA: Diagnosis not present

## 2019-08-11 DIAGNOSIS — G43909 Migraine, unspecified, not intractable, without status migrainosus: Secondary | ICD-10-CM | POA: Diagnosis not present

## 2019-08-11 DIAGNOSIS — H538 Other visual disturbances: Secondary | ICD-10-CM | POA: Insufficient documentation

## 2019-08-11 DIAGNOSIS — Z8673 Personal history of transient ischemic attack (TIA), and cerebral infarction without residual deficits: Secondary | ICD-10-CM | POA: Diagnosis not present

## 2019-09-08 ENCOUNTER — Ambulatory Visit: Payer: BC Managed Care – PPO | Admitting: Neurology

## 2019-09-08 ENCOUNTER — Encounter: Payer: Self-pay | Admitting: Neurology

## 2019-09-08 VITALS — BP 158/100 | HR 86 | Ht 68.0 in | Wt 179.0 lb

## 2019-09-08 DIAGNOSIS — G459 Transient cerebral ischemic attack, unspecified: Secondary | ICD-10-CM | POA: Diagnosis not present

## 2019-09-08 DIAGNOSIS — G43009 Migraine without aura, not intractable, without status migrainosus: Secondary | ICD-10-CM | POA: Diagnosis not present

## 2019-09-08 MED ORDER — PROPRANOLOL HCL 20 MG PO TABS
ORAL_TABLET | ORAL | 3 refills | Status: DC
Start: 2019-09-08 — End: 2023-04-25

## 2019-09-08 NOTE — Patient Instructions (Signed)
We will start propranolol for the migraine events.  Inderal (propranolol) is a blood pressure medication that is commonly used for migraine headaches. This is a type of beta blocker. The most common side effects include low heart rate, dizziness, fatigue, and increased depression. This medication may worsen asthma. If you believe that you are having side effects on this medication, please contact our office.

## 2019-09-08 NOTE — Progress Notes (Signed)
Reason for visit: Migraine headache, TIA type events  Referring physician: Dr. Benay Spice is a 64 y.o. male  History of present illness:  Sean Pugh is a 64 year old right-handed white male who has been seen previously in this office for migraine type events that were relatively infrequent.  Beginning around 13 August 2019, he began having frequent episodes of left eye blurred vision, he also sustained some paresthesias and numbness of the left face and head and within 2 minutes this would spread to involve the left arm.  The entire event would last about 5 minutes and then resolve.  The patient has covered 1 eye and then the other and is certain that only the left eye is involved.  He indicates that the activator for these events is trying to focus on a computer screen or trying to read a book.  If he does not do these activities, he does not tend to have these events.  These episodes are interfering with his ability to perform his job.  He may feel a pop in the head prior to onset of the symptoms.  He reports some cognitive difficulties with difficulty with word finding during the episodes.  He reports no change in balance or difficulty with ambulation.  He has no problems with using the left arm but there is some indication there may be some slight weakness of the grip of the left hand during the event.  He does report occasional mild headache with the episodes, his last typical migraine was about 6 weeks ago.  The patient has undergone a MRI of the brain that was unremarkable.  He is on aspirin therapy, he takes this on a daily basis and this was started well prior to the onset of the above events.  At times he may be slightly queasy with the events described above.  He is sent to this office for an evaluation.  Past Medical History:  Diagnosis Date  . Basal cell carcinoma    Skin   . Kidney stones   . Memory loss   . Migraine headache without aura   . Sciatica 11/10/2012    Left-sided sciatica  . Scoliosis   . Tremor     Past Surgical History:  Procedure Laterality Date  . HERNIA REPAIR    . KIDNEY STONE SURGERY    . SHOULDER SURGERY Right     Family History  Problem Relation Age of Onset  . Cancer Father   . Stroke Paternal Grandmother   . Cancer Paternal Grandfather   . Multiple sclerosis Cousin   . Diabetes Cousin   . Migraines Son     Social history:  reports that he has never smoked. He has never used smokeless tobacco. He reports current alcohol use. He reports that he does not use drugs.  Medications:  Prior to Admission medications   Medication Sig Start Date End Date Taking? Authorizing Provider  aspirin 81 MG tablet Take 81 mg by mouth daily.   Yes [provider]  ibuprofen (ADVIL,MOTRIN) 200 MG tablet Take by mouth every 6 (six) hours as needed. 1 -2 tabs prn headache.   Yes [provider]  Multiple Vitamin (MULTIVITAMIN) tablet Take 1 tablet by mouth daily.   Yes [provider]      Allergies  Allergen Reactions  . Ivp Dye [Iodinated Diagnostic Agents]     Swelling, rash  . Sulfa Antibiotics Rash    ROS:  Out of a complete  14 system review of symptoms, the patient complains only of the following symptoms, and all other reviewed systems are negative.  Left face and arm numbness Headache  Blood pressure (!) 158/100, pulse 86, height 5\' 8"  (1.727 m), weight 179 lb (81.2 kg).  Physical Exam  General: The patient is alert and cooperative at the time of the examination.  Eyes: Pupils are equal, round, and reactive to light. Discs are flat bilaterally.  Neck: The neck is supple, no carotid bruits are noted.  Respiratory: The respiratory examination is clear.  Cardiovascular: The cardiovascular examination reveals a regular rate and rhythm, no obvious murmurs or rubs are noted.  Skin: Extremities are without significant edema.  Neurologic Exam  Mental status: The patient is alert and  oriented x 3 at the time of the examination. The patient has apparent normal recent and remote memory, with an apparently normal attention span and concentration ability.  Cranial nerves: Facial symmetry is present. There is a reduction of pinprick sensation on the left face and head as compared to the right, the patient does not split the midline with vibration sensation on the forehead. The strength of the facial muscles and the muscles to head turning and shoulder shrug are normal bilaterally. Speech is well enunciated, no aphasia or dysarthria is noted. Extraocular movements are full. Visual fields are full. The tongue is midline, and the patient has symmetric elevation of the soft palate. No obvious hearing deficits are noted.  Motor: The motor testing reveals 5 over 5 strength of all 4 extremities. Good symmetric motor tone is noted throughout.  Sensory: Sensory testing is intact to pinprick, soft touch, vibration sensation, and position sense on all 4 extremities. No evidence of extinction is noted.  Coordination: Cerebellar testing reveals good finger-nose-finger and heel-to-shin bilaterally.  Gait and station: Gait is normal. Tandem gait is normal. Romberg is negative. No drift is seen.  Reflexes: Deep tendon reflexes are symmetric and normal bilaterally. Toes are downgoing bilaterally.    MRI brain 08/11/19:  IMPRESSION: No acute finding to explain the clinical presentation. Normal study with exception of 2 likely incidental punctate foci of T2 and FLAIR signal within the subcortical white matter of the right posterior frontal lobe.  * MRI scan images were reviewed online. I agree with the written report.   Carotid doppler 04/03/18:  Summary:  Right Carotid: Mild: plaque visualized in the right internal carotid  artery. Minimal soft plaque in the bulb.  Left Carotid: Normal: little or no evidence of plaque visualized in the  left internal carotid artery.        Assessment/Plan:  1.  History of migraine headache  2.  Episodes of left face and arm numbness, blurring of vision of the left eye, probable migraine equivalent  The episodes described above are brought on by certain activities, there is a gradual spread of sensation alteration from the left face to the left arm.  These features are more consistent with a migraine type event.  However, the patient does require a full work-up for TIA, we will check MRA of the head and neck.  He cannot have x-ray dye given a prior history of allergy.  He will continue on aspirin.  We will add propranolol 20 mg twice daily for 2 weeks and then go to 40 mg twice daily.  He will call if he is not tolerating the drug.  The patient will follow up in 3 months.  The events described above are a new phenomena, he  has never experienced these events previously.  Jill Alexanders MD 09/08/2019 7:17 AM  [facility] [address] [place], [place] [address]  Phone [phone] Fax [phone]

## 2019-09-10 ENCOUNTER — Telehealth: Payer: Self-pay | Admitting: Neurology

## 2019-09-10 NOTE — Telephone Encounter (Signed)
no to the covid questions MRA Head wo contrast & MRA Neck w/wo contrast Dr. Stephanie Acre Auth: [id number] (exp. 09/10/19 to 03/07/20). Patient is scheduled at [facility] for 09/16/19.

## 2019-09-14 ENCOUNTER — Telehealth: Payer: Self-pay

## 2019-09-14 NOTE — Telephone Encounter (Signed)
Pt called office and left a VM asking about his MRI appt this week. Please call him at [phone].

## 2019-09-14 NOTE — Telephone Encounter (Signed)
Left a voicemail for the patient to call back. If he had any questions about his upcoming appt.

## 2019-09-15 ENCOUNTER — Telehealth: Payer: Self-pay | Admitting: Neurology

## 2019-09-15 NOTE — Telephone Encounter (Signed)
Okay to take Advil as needed intermittently but not on a daily basis for 4 weeks together

## 2019-09-15 NOTE — Telephone Encounter (Signed)
Pt would like to know if ok to take an advil

## 2019-09-16 ENCOUNTER — Telehealth: Payer: Self-pay

## 2019-09-16 ENCOUNTER — Other Ambulatory Visit: Payer: Self-pay

## 2019-09-16 ENCOUNTER — Ambulatory Visit: Payer: BC Managed Care – PPO

## 2019-09-16 DIAGNOSIS — G43009 Migraine without aura, not intractable, without status migrainosus: Secondary | ICD-10-CM

## 2019-09-16 DIAGNOSIS — G459 Transient cerebral ischemic attack, unspecified: Secondary | ICD-10-CM

## 2019-09-16 MED ORDER — GADOBENATE DIMEGLUMINE 529 MG/ML IV SOLN
20.0000 mL | Freq: Once | INTRAVENOUS | Status: AC | PRN
  Administered 2019-09-16: 20 mL via INTRAVENOUS

## 2019-09-16 NOTE — Telephone Encounter (Signed)
Patient states that since starting the propranolol, he's been having issues in his lower back that he hasn't had in quite some time. He is wondering if this medicine could be responsible before they jump to the conclusion of his back issues returning.

## 2019-09-16 NOTE — Telephone Encounter (Signed)
Called the patient and advised that Dr Leonie Man agreed that it is fine for the patient to do advil but only as needed. Pt states that the 20mg  BID dose Is not helping. Advised that medication can take a little bit to work and kick in to feel effects. Also stated that the goal is to work him up to a higher dose. Informed the patient that the MD will be back in next week and also will have access to MRI results by then and at that point he can discuss if any changes need to be addressed with the medications Pt verbalized understanding.

## 2019-09-16 NOTE — Telephone Encounter (Signed)
I returned the call to the patient. Reports history of scoliosis, herniated discs and arthritis in back. He has been seeing Dr. Rennis Harding for these problems for years. He is aware that the propranolol should not exacerbate these issues. States he has already set up a follow up with Dr. Patrice Paradise.

## 2019-09-18 ENCOUNTER — Telehealth: Payer: Self-pay | Admitting: Neurology

## 2019-09-18 NOTE — Telephone Encounter (Signed)
I called the patient.  MRA of the head and neck is completely normal.  He continues to have events that are likely related to migraine equivalent.  The propranolol so far that low-dose has not helped, he will be going up to 40 mg twice daily next week.  If this medication is not effective, may have to try something else.   MRA neck 09/17/19:  IMPRESSION:   Normal MRA neck (with and without).    MRA head 09/17/19:  IMPRESSION:   Normal MRA head (without).

## 2019-10-28 DIAGNOSIS — M5412 Radiculopathy, cervical region: Secondary | ICD-10-CM | POA: Diagnosis not present

## 2019-10-28 DIAGNOSIS — M41125 Adolescent idiopathic scoliosis, thoracolumbar region: Secondary | ICD-10-CM | POA: Diagnosis not present

## 2019-10-28 DIAGNOSIS — M542 Cervicalgia: Secondary | ICD-10-CM | POA: Diagnosis not present

## 2019-10-28 DIAGNOSIS — M419 Scoliosis, unspecified: Secondary | ICD-10-CM | POA: Diagnosis not present

## 2019-11-03 DIAGNOSIS — Z20822 Contact with and (suspected) exposure to covid-19: Secondary | ICD-10-CM | POA: Diagnosis not present

## 2019-11-10 DIAGNOSIS — J189 Pneumonia, unspecified organism: Secondary | ICD-10-CM | POA: Diagnosis not present

## 2019-11-10 DIAGNOSIS — R05 Cough: Secondary | ICD-10-CM | POA: Diagnosis not present

## 2019-11-10 DIAGNOSIS — U071 COVID-19: Secondary | ICD-10-CM | POA: Diagnosis not present

## 2019-11-11 ENCOUNTER — Other Ambulatory Visit: Payer: Self-pay | Admitting: Oncology

## 2019-11-11 ENCOUNTER — Ambulatory Visit (HOSPITAL_COMMUNITY)
Admission: RE | Admit: 2019-11-11 | Discharge: 2019-11-11 | Disposition: A | Discharge status: Home / Self Care | Payer: BC Managed Care – PPO | Source: Ambulatory Visit | Attending: Pulmonary Disease | Admitting: Pulmonary Disease

## 2019-11-11 ENCOUNTER — Encounter: Payer: Self-pay | Admitting: Oncology

## 2019-11-11 ENCOUNTER — Telehealth: Payer: Self-pay | Admitting: Oncology

## 2019-11-11 DIAGNOSIS — U071 COVID-19: Secondary | ICD-10-CM | POA: Diagnosis not present

## 2019-11-11 MED ORDER — SODIUM CHLORIDE 0.9 % IV SOLN
1200.0000 mg | Freq: Once | INTRAVENOUS | Status: AC
  Administered 2019-11-11: 1200 mg via INTRAVENOUS
  Filled 2019-11-11: qty 10

## 2019-11-11 MED ORDER — EPINEPHRINE 0.3 MG/0.3ML IJ SOAJ: 0.3000 mg | Freq: Once | INTRAMUSCULAR | Status: DC | PRN

## 2019-11-11 MED ORDER — METHYLPREDNISOLONE SODIUM SUCC 125 MG IJ SOLR: 125.0000 mg | Freq: Once | INTRAMUSCULAR | Status: DC | PRN

## 2019-11-11 MED ORDER — ALBUTEROL SULFATE HFA 108 (90 BASE) MCG/ACT IN AERS: 2.0000 | INHALATION_SPRAY | Freq: Once | RESPIRATORY_TRACT | Status: DC | PRN

## 2019-11-11 MED ORDER — DIPHENHYDRAMINE HCL 50 MG/ML IJ SOLN: 50.0000 mg | Freq: Once | INTRAMUSCULAR | Status: DC | PRN

## 2019-11-11 MED ORDER — FAMOTIDINE IN NACL 20-0.9 MG/50ML-% IV SOLN: 20.0000 mg | Freq: Once | INTRAVENOUS | Status: DC | PRN

## 2019-11-11 MED ORDER — SODIUM CHLORIDE 0.9 % IV SOLN: INTRAVENOUS | Status: DC | PRN

## 2019-11-11 NOTE — Telephone Encounter (Signed)
Called to Discuss with patient about Covid symptoms and the use of regeneron, a monoclonal antibody infusion for those with mild to moderate Covid symptoms and at a high risk of hospitalization.     Pt is qualified for this infusion at the [facility] infusion center due to co-morbid conditions and/or a member of an at-risk group.     Unable to reach pt. Left message to return call- Left Mychart message   Rulon Abide, AGNP-C [phone] [facility])

## 2019-11-11 NOTE — Progress Notes (Signed)
I connected by phone with  Mr. Popoff to discuss the potential use of an new treatment for mild to moderate COVID-19 viral infection in non-hospitalized patients.   This patient is a age/sex that meets the FDA criteria for Emergency Use Authorization of casirivimab\imdevimab.  Has a (+) direct SARS-CoV-2 viral test result 1. Has mild or moderate COVID-19  2. Is ? 63 years of age and weighs ? 40 kg 3. Is NOT hospitalized due to COVID-19 4. Is NOT requiring oxygen therapy or requiring an increase in baseline oxygen flow rate due to COVID-19 5. Is within 10 days of symptom onset 6. Has at least one of the high risk factor(s) for progression to severe COVID-19 and/or hospitalization as defined in EUA. ? Specific high risk criteria : HTN     Symptom onset- 11/03/2019   I have spoken and communicated the following to the patient or parent/caregiver:   1. FDA has authorized the emergency use of casirivimab\imdevimab for the treatment of mild to moderate COVID-19 in adults and pediatric patients with positive results of direct SARS-CoV-2 viral testing who are 29 years of age and older weighing at least 40 kg, and who are at high risk for progressing to severe COVID-19 and/or hospitalization.   2. The significant known and potential risks and benefits of casirivimab\imdevimab, and the extent to which such potential risks and benefits are unknown.   3. Information on available alternative treatments and the risks and benefits of those alternatives, including clinical trials.   4. Patients treated with casirivimab\imdevimab should continue to self-isolate and use infection control measures (e.g., wear mask, isolate, social distance, avoid sharing personal items, clean and disinfect "high touch" surfaces, and frequent handwashing) according to CDC guidelines.    5. The patient or parent/caregiver has the option to accept or refuse casirivimab\imdevimab .   After reviewing this information with the  patient, The patient agreed to proceed with receiving casirivimab\imdevimab infusion and will be provided a copy of the Fact sheet prior to receiving the infusion.Rulon Abide, AGNP-C [phone] ([place])

## 2019-11-11 NOTE — Discharge Instructions (Signed)

## 2019-11-11 NOTE — Progress Notes (Signed)
  Diagnosis: COVID-19  Physician: Dr. Joya Gaskins  Procedure: Covid Infusion Clinic Med: casirivimab\imdevimab infusion - Provided patient with casirivimab\imdevimab fact sheet for patients, parents and caregivers prior to infusion.  Complications: No immediate complications noted.  Discharge: Discharged home   Sean Pugh 11/11/2019

## 2019-12-01 DIAGNOSIS — G43909 Migraine, unspecified, not intractable, without status migrainosus: Secondary | ICD-10-CM | POA: Diagnosis not present

## 2019-12-01 DIAGNOSIS — R05 Cough: Secondary | ICD-10-CM | POA: Diagnosis not present

## 2019-12-01 DIAGNOSIS — Z0000 Encounter for general adult medical examination without abnormal findings: Secondary | ICD-10-CM | POA: Diagnosis not present

## 2019-12-04 DIAGNOSIS — D225 Melanocytic nevi of trunk: Secondary | ICD-10-CM | POA: Diagnosis not present

## 2019-12-04 DIAGNOSIS — L821 Other seborrheic keratosis: Secondary | ICD-10-CM | POA: Diagnosis not present

## 2019-12-04 DIAGNOSIS — L578 Other skin changes due to chronic exposure to nonionizing radiation: Secondary | ICD-10-CM | POA: Diagnosis not present

## 2019-12-04 DIAGNOSIS — L570 Actinic keratosis: Secondary | ICD-10-CM | POA: Diagnosis not present

## 2019-12-09 ENCOUNTER — Ambulatory Visit: Payer: BC Managed Care – PPO | Admitting: Neurology

## 2019-12-09 ENCOUNTER — Encounter: Payer: Self-pay | Admitting: Neurology

## 2019-12-09 ENCOUNTER — Other Ambulatory Visit: Payer: Self-pay

## 2019-12-09 VITALS — BP 125/80 | HR 92

## 2019-12-09 DIAGNOSIS — G43009 Migraine without aura, not intractable, without status migrainosus: Secondary | ICD-10-CM

## 2019-12-09 NOTE — Progress Notes (Signed)
PATIENT: Sean Pugh DOB: [birth year]  REASON FOR VISIT: follow up HISTORY FROM: patient  HISTORY OF PRESENT ILLNESS: Today 12/09/19 Sean Pugh is a 64 year old male with history of migraine type events, in June 2021, started having new episodes of left eye blurry vision, paresthesias/numbness of the left face and head and involve the left arm.  Activated by trying to focus on a computer screen or read a book.  MRI of the brain was unremarkable, he is on aspirin.  MRA of the head and neck was normal.  He was started on propanolol, now up to 40 mg twice a day. The episodes were felt to be a type of migraine. He would relieve these, by taking a break from the computer screen, go for a walk, or lay down, would resolve.  He stopped the propanolol, made him feel jumpy.  In August, he contracted Covid, developed PNA.  After he recovered, he has not had any further episodes as described.  He has chronic migraines, will have "cracked screen" visual disturbance of both eyes, takes a few Advil with benefit.  Is doing well now, had routine physical with good findings, walks over an 1 hour daily.  Here today for follow-up unaccompanied.  HISTORY 09/08/2019 Dr. Jannifer Franklin: Sean Pugh is a 64 year old right-handed white male who has been seen previously in this office for migraine type events that were relatively infrequent.  Beginning around 13 August 2019, he began having frequent episodes of left eye blurred vision, he also sustained some paresthesias and numbness of the left face and head and within 2 minutes this would spread to involve the left arm.  The entire event would last about 5 minutes and then resolve.  The patient has covered 1 eye and then the other and is certain that only the left eye is involved.  He indicates that the activator for these events is trying to focus on a computer screen or trying to read a book.  If he does not do these activities, he does not tend to have these events.  These episodes  are interfering with his ability to perform his job.  He may feel a pop in the head prior to onset of the symptoms.  He reports some cognitive difficulties with difficulty with word finding during the episodes.  He reports no change in balance or difficulty with ambulation.  He has no problems with using the left arm but there is some indication there may be some slight weakness of the grip of the left hand during the event.  He does report occasional mild headache with the episodes, his last typical migraine was about 6 weeks ago.  The patient has undergone a MRI of the brain that was unremarkable.  He is on aspirin therapy, he takes this on a daily basis and this was started well prior to the onset of the above events.  At times he may be slightly queasy with the events described above.  He is sent to this office for an evaluation.   REVIEW OF SYSTEMS: Out of a complete 14 system review of symptoms, the patient complains only of the following symptoms, and all other reviewed systems are negative.  Headache  ALLERGIES: Allergies  Allergen Reactions   Ivp Dye [Iodinated Diagnostic Agents]     Swelling, rash   Sulfa Antibiotics Rash    HOME MEDICATIONS: Outpatient Medications Prior to Visit  Medication Sig Dispense Refill   aspirin 81 MG tablet Take 81 mg by mouth  daily.     ibuprofen (ADVIL,MOTRIN) 200 MG tablet Take by mouth every 6 (six) hours as needed. 1 -2 tabs prn headache.     Multiple Vitamin (MULTIVITAMIN) tablet Take 1 tablet by mouth daily.     propranolol (INDERAL) 20 MG tablet One tablet twice a day for 2 weeks, then take 2 tablets twice a day 120 tablet 3   No facility-administered medications prior to visit.    PAST MEDICAL HISTORY: Past Medical History:  Diagnosis Date   Basal cell carcinoma    Skin    Kidney stones    Memory loss    Migraine headache without aura    Sciatica 11/10/2012   Left-sided sciatica   Scoliosis    Tremor     PAST SURGICAL  HISTORY: Past Surgical History:  Procedure Laterality Date   HERNIA REPAIR     KIDNEY STONE SURGERY     SHOULDER SURGERY Right     FAMILY HISTORY: Family History  Problem Relation Age of Onset   Cancer Father    Stroke Paternal Grandmother    Cancer Paternal Grandfather    Multiple sclerosis Cousin    Diabetes Cousin    Migraines Son     SOCIAL HISTORY: Social History   Socioeconomic History   Marital status: Married    Spouse name: [name]    Number of children: 1   Years of education: Secretary/administrator   Highest education level: Not on file  Occupational History   Not on file  Tobacco Use   Smoking status: Never Smoker   Smokeless tobacco: Never Used  Substance and Sexual Activity   Alcohol use: Yes    Comment: occasional   Drug use: No   Sexual activity: Not on file  Other Topics Concern   Not on file  Social History Narrative   Patient lives at home with wife and son. 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    Patient has one child.    Social Determinants of Health   Financial Resource Strain:    Difficulty of Paying Living Expenses: Not on file  Food Insecurity:    Worried About Charity fundraiser in the Last Year: Not on file   [employer] of Food in the Last Year: Not on file  Transportation Needs:    Lack of Transportation (Medical): Not on file   Lack of Transportation (Non-Medical): Not on file  Physical Activity:    Days of Exercise per Week: Not on file   Minutes of Exercise per Session: Not on file  Stress:    Feeling of Stress : Not on file  Social Connections:    Frequency of Communication with Friends and Family: Not on file   Frequency of Social Gatherings with Friends and Family: Not on file   Attends Religious Services: Not on file   Active Member of Clubs or Organizations: Not on file   Attends Archivist Meetings: Not on file   Marital Status: Not on file   Intimate Partner Violence:    Fear of Current or Ex-Partner: Not on file   Emotionally Abused: Not on file   Physically Abused: Not on file   Sexually Abused: Not on file   PHYSICAL EXAM  Vitals:   12/09/19 0736  BP: 125/80  Pulse: 92   There is no height or weight on file to calculate BMI.  Generalized: Well developed, in no acute distress  Neurological examination  Mentation: Alert oriented to time, place, history taking. Follows all commands speech and language fluent Cranial nerve II-XII: Pupils were equal round reactive to light. Extraocular movements were full, visual field were full on confrontational test. Facial sensation and strength were normal. Head turning and shoulder shrug  were normal and symmetric. Motor: The motor testing reveals 5 over 5 strength of all 4 extremities. Good symmetric motor tone is noted throughout.  Sensory: Sensory testing is intact to soft touch on all 4 extremities. No evidence of extinction is noted.  Coordination: Cerebellar testing reveals good finger-nose-finger and heel-to-shin bilaterally.  Gait and station: Gait is normal. Tandem gait is normal. Romberg is negative. No drift is seen.  Reflexes: Deep tendon reflexes are symmetric and normal bilaterally.   DIAGNOSTIC DATA (LABS, IMAGING, TESTING) - I reviewed patient records, labs, notes, testing and imaging myself where available.  Lab Results  Component Value Date   HGB 15.0 06/13/2012   HCT 44.0 06/13/2012      Component Value Date/Time   NA 141 06/13/2012 1849   K 4.0 06/13/2012 1849   CL 102 06/13/2012 1849   GLUCOSE 98 06/13/2012 1849   BUN 16 06/13/2012 1849   CREATININE 1.20 06/13/2012 1849   No results found for: CHOL, HDL, LDLCALC, LDLDIRECT, TRIG, CHOLHDL No results found for: HGBA1C No results found for: VITAMINB12 No results found for: TSH  ASSESSMENT AND PLAN 64 y.o. year old male  has a past medical history of Basal cell carcinoma, Kidney stones, Memory  loss, Migraine headache without aura, Sciatica (11/10/2012), Scoliosis, and Tremor. here with:  1.  History of migraine headache 2.  Episodes of left face and arm numbness, blurring of vision in the left eye, probable migraine equivalent -No further episodes of the left-sided numbness, following illness with Covid -Propanolol was not helpful, had side effect -Since doing well, we will not initiate any additional medication, if symptoms return, we may try Topamax -MRA of the head and neck was normal -He remains on aspirin 81 mg daily -Continue routine follow-up with PCP, follow-up here on as-needed basis  I spent 30 minutes of face-to-face and non-face-to-face time with patient.  This included previsit chart review, lab review, study review, order entry, electronic health record documentation, patient education.  Butler Denmark, AGNP-C, DNP 12/09/2019, 8:01 AM [facility] [address] [place], [place] [address] [phone]

## 2019-12-09 NOTE — Patient Instructions (Signed)
I am glad you are doing well! Stay off the propranolol  If episodes return, let me know See you back as need

## 2019-12-11 NOTE — Progress Notes (Signed)
I have read the note, and I agree with the clinical assessment and plan.  Shalicia Craghead K [employer] Dentler   

## 2020-01-14 DIAGNOSIS — R413 Other amnesia: Secondary | ICD-10-CM | POA: Diagnosis not present

## 2020-01-14 DIAGNOSIS — J189 Pneumonia, unspecified organism: Secondary | ICD-10-CM | POA: Diagnosis not present

## 2020-03-14 DIAGNOSIS — M542 Cervicalgia: Secondary | ICD-10-CM | POA: Diagnosis not present

## 2020-03-14 DIAGNOSIS — M25511 Pain in right shoulder: Secondary | ICD-10-CM | POA: Diagnosis not present

## 2020-10-14 DIAGNOSIS — M7130 Other bursal cyst, unspecified site: Secondary | ICD-10-CM | POA: Diagnosis not present

## 2020-11-29 DIAGNOSIS — J069 Acute upper respiratory infection, unspecified: Secondary | ICD-10-CM | POA: Diagnosis not present

## 2020-11-29 DIAGNOSIS — R051 Acute cough: Secondary | ICD-10-CM | POA: Diagnosis not present

## 2020-11-29 DIAGNOSIS — R5383 Other fatigue: Secondary | ICD-10-CM | POA: Diagnosis not present

## 2020-11-29 DIAGNOSIS — G459 Transient cerebral ischemic attack, unspecified: Secondary | ICD-10-CM | POA: Diagnosis not present

## 2020-11-29 DIAGNOSIS — Z125 Encounter for screening for malignant neoplasm of prostate: Secondary | ICD-10-CM | POA: Diagnosis not present

## 2020-12-07 DIAGNOSIS — R195 Other fecal abnormalities: Secondary | ICD-10-CM | POA: Diagnosis not present

## 2020-12-07 DIAGNOSIS — Z1331 Encounter for screening for depression: Secondary | ICD-10-CM | POA: Diagnosis not present

## 2020-12-07 DIAGNOSIS — Z1339 Encounter for screening examination for other mental health and behavioral disorders: Secondary | ICD-10-CM | POA: Diagnosis not present

## 2020-12-07 DIAGNOSIS — Z0000 Encounter for general adult medical examination without abnormal findings: Secondary | ICD-10-CM | POA: Diagnosis not present

## 2020-12-07 DIAGNOSIS — Z23 Encounter for immunization: Secondary | ICD-10-CM | POA: Diagnosis not present

## 2020-12-09 DIAGNOSIS — L570 Actinic keratosis: Secondary | ICD-10-CM | POA: Diagnosis not present

## 2020-12-09 DIAGNOSIS — D225 Melanocytic nevi of trunk: Secondary | ICD-10-CM | POA: Diagnosis not present

## 2020-12-09 DIAGNOSIS — L821 Other seborrheic keratosis: Secondary | ICD-10-CM | POA: Diagnosis not present

## 2020-12-09 DIAGNOSIS — L578 Other skin changes due to chronic exposure to nonionizing radiation: Secondary | ICD-10-CM | POA: Diagnosis not present

## 2020-12-21 DIAGNOSIS — H01006 Unspecified blepharitis left eye, unspecified eyelid: Secondary | ICD-10-CM | POA: Diagnosis not present

## 2021-02-18 DIAGNOSIS — M542 Cervicalgia: Secondary | ICD-10-CM | POA: Diagnosis not present

## 2021-03-08 DIAGNOSIS — M542 Cervicalgia: Secondary | ICD-10-CM | POA: Diagnosis not present

## 2021-04-11 DIAGNOSIS — R0981 Nasal congestion: Secondary | ICD-10-CM | POA: Diagnosis not present

## 2021-04-11 DIAGNOSIS — M542 Cervicalgia: Secondary | ICD-10-CM | POA: Diagnosis not present

## 2021-04-11 DIAGNOSIS — R509 Fever, unspecified: Secondary | ICD-10-CM | POA: Diagnosis not present

## 2021-04-18 DIAGNOSIS — R0981 Nasal congestion: Secondary | ICD-10-CM | POA: Diagnosis not present

## 2021-05-10 DIAGNOSIS — M5412 Radiculopathy, cervical region: Secondary | ICD-10-CM | POA: Diagnosis not present

## 2021-05-10 DIAGNOSIS — Z6828 Body mass index (BMI) 28.0-28.9, adult: Secondary | ICD-10-CM | POA: Diagnosis not present

## 2021-05-10 DIAGNOSIS — M542 Cervicalgia: Secondary | ICD-10-CM | POA: Diagnosis not present

## 2021-05-23 DIAGNOSIS — R531 Weakness: Secondary | ICD-10-CM | POA: Diagnosis not present

## 2021-05-23 DIAGNOSIS — R29898 Other symptoms and signs involving the musculoskeletal system: Secondary | ICD-10-CM | POA: Diagnosis not present

## 2021-05-23 DIAGNOSIS — M542 Cervicalgia: Secondary | ICD-10-CM | POA: Diagnosis not present

## 2021-06-01 DIAGNOSIS — R29898 Other symptoms and signs involving the musculoskeletal system: Secondary | ICD-10-CM | POA: Diagnosis not present

## 2021-06-01 DIAGNOSIS — R531 Weakness: Secondary | ICD-10-CM | POA: Diagnosis not present

## 2021-06-01 DIAGNOSIS — M542 Cervicalgia: Secondary | ICD-10-CM | POA: Diagnosis not present

## 2021-06-06 DIAGNOSIS — L239 Allergic contact dermatitis, unspecified cause: Secondary | ICD-10-CM | POA: Diagnosis not present

## 2021-06-07 IMAGING — MR MR HEAD W/O CM
10 series · 48 of 48 positions shown · non-contrast
Comparison: None.

CLINICAL DATA: Left eye vision disturbance. Left facial tingling, 2
days duration.

EXAM:
MRI HEAD WITHOUT CONTRAST
TECHNIQUE: Multiplanar, multiecho pulse sequences of the brain and surrounding
structures were obtained without intravenous contrast.

[Series 5: T1 · sagittal · 5.0mm · 0.75mm/px · 1 of 24 slices shown (1 of 2)]
[im 1/24]
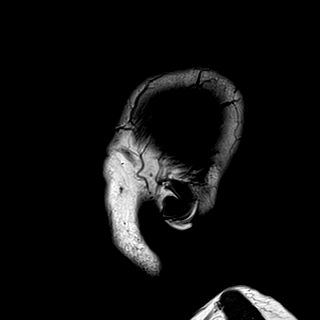

[Series 6: DWI · axial · 3.0mm · 1.36mm/px · z∈[-33,+105]mm · 8 of 96 slices shown (1 of 4)]
[im 1/96]
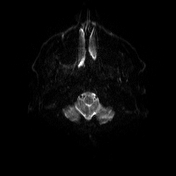
[im 14/96]
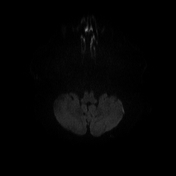
[im 28/96]
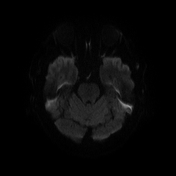
[im 41/96]
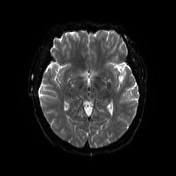
[im 55/96]
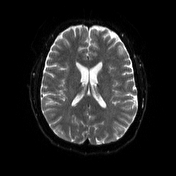
[im 68/96]
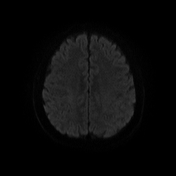
[im 82/96]
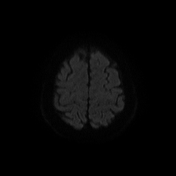
[im 96/96]
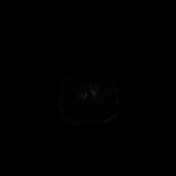

[Series 7: DWI · axial · 3.0mm · 1.36mm/px · z∈[-33,+105]mm · 4 of 48 slices shown (2 of 4)]
[im 1/48]
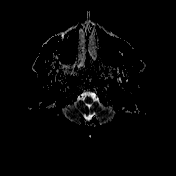
[im 16/48]
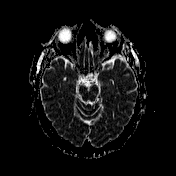
[im 32/48]
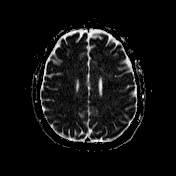
[im 48/48]
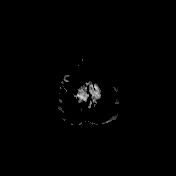

[Series 8: T2 · axial · 5.0mm · 0.62mm/px · z∈[-45,+115]mm · 2 of 26 slices shown (1 of 2)]
[im 1/26]
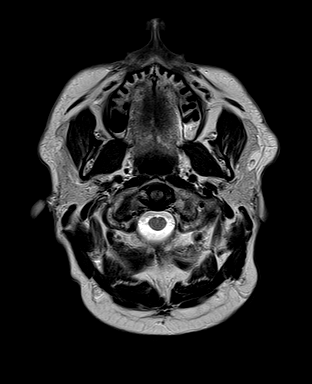
[im 26/26]
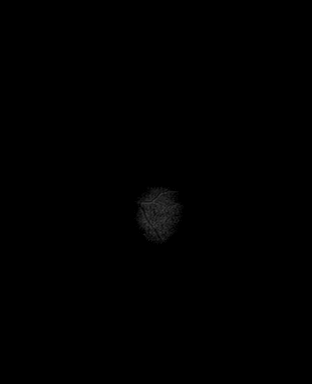

[Series 10: swi_images · axial · 3.0mm · 0.75mm/px · z∈[-70,+140]mm · 6 of 72 slices shown]
[im 1/72]
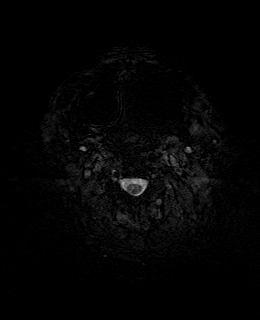
[im 15/72]
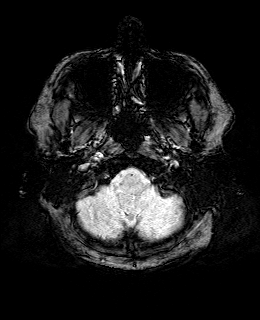
[im 29/72]
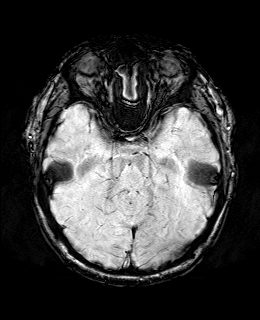
[im 43/72]
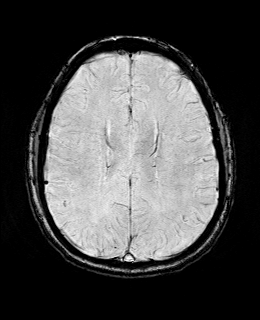
[im 57/72]
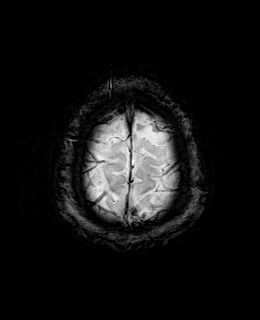
[im 72/72]
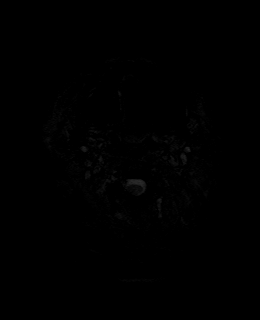

[Series 11: FLAIR · axial · 3.0mm · 0.75mm/px · z∈[-40,+110]mm · 4 of 52 slices shown]
[im 1/52]
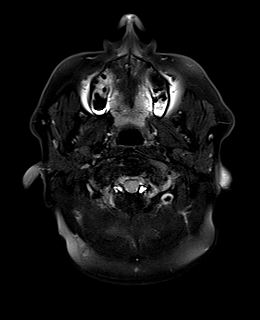
[im 18/52]
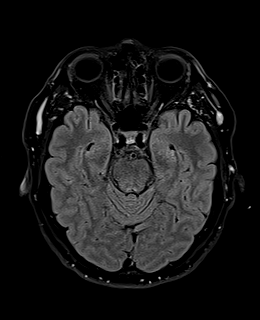
[im 35/52]
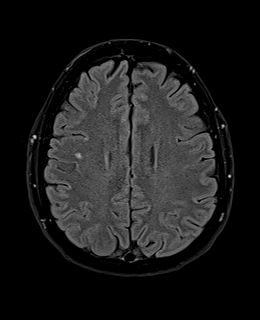
[im 52/52]
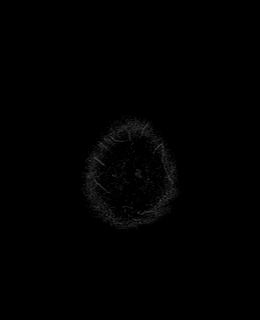

[Series 12: T1 · axial · 1.0mm · 0.94mm/px · z∈[-36,+105]mm · 12 of 144 slices shown (2 of 2)]
[im 1/144]
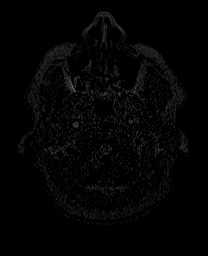
[im 14/144]
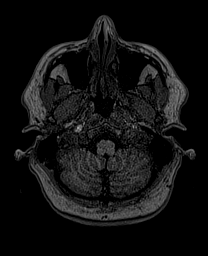
[im 27/144]
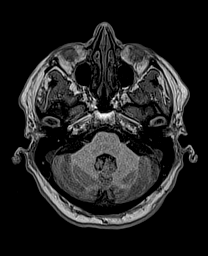
[im 40/144]
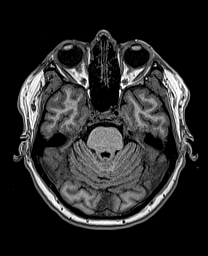
[im 53/144]
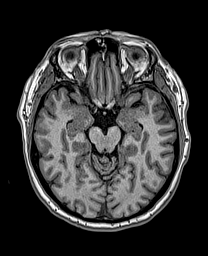
[im 66/144]
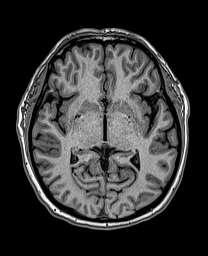
[im 79/144]
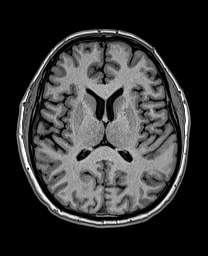
[im 92/144]
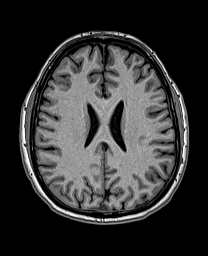
[im 105/144]
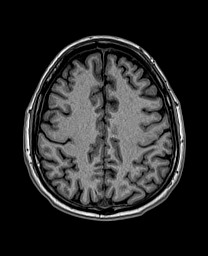
[im 118/144]
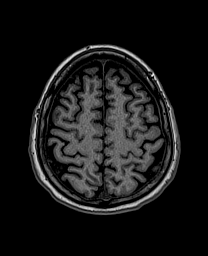
[im 131/144]
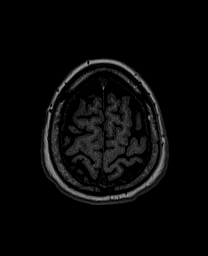
[im 144/144]
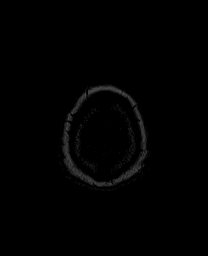

[Series 13: DWI · coronal · 5.0mm · 1.31mm/px · 5 of 64 slices shown (3 of 4)]
[im 1/64]
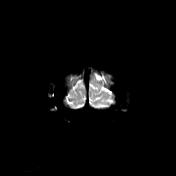
[im 16/64]
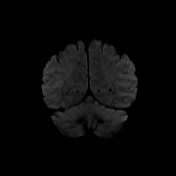
[im 32/64]
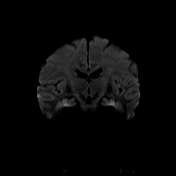
[im 48/64]
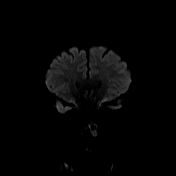
[im 64/64]
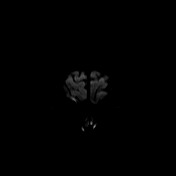

[Series 14: DWI · coronal · 5.0mm · 1.31mm/px · 3 of 32 slices shown (4 of 4)]
[im 1/32]
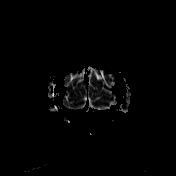
[im 16/32]
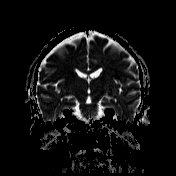
[im 32/32]
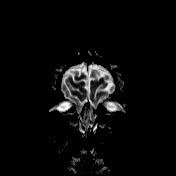

[Series 15: T2 · coronal · 5.0mm · 0.57mm/px · 3 of 32 slices shown (2 of 2)]
[im 1/32]
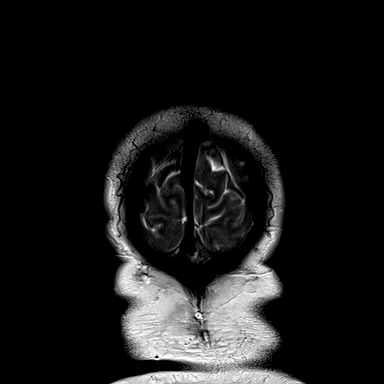
[im 16/32]
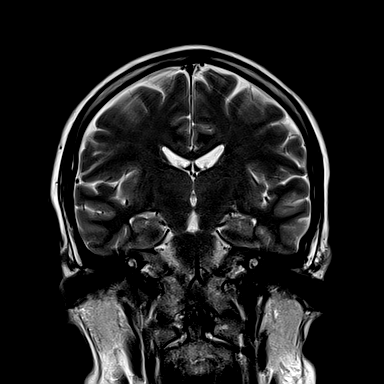
[im 32/32]
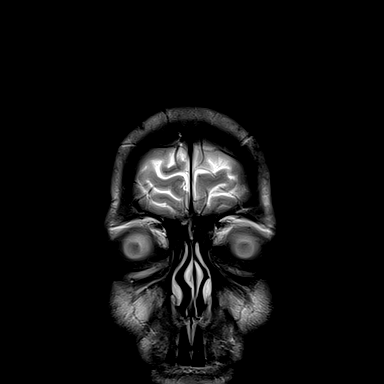

[48 of 48 positions shown; findings below may reference images not displayed]

FINDINGS: Brain: Diffusion imaging does not show any acute or subacute
infarction. The brainstem and cerebellum are normal. Cerebral
hemispheres are normal except for 2 small foci of T2 and FLAIR
signal in the subcortical white matter of the right posterior
frontal lobe, not likely significant. No cortical abnormality. No
mass lesion, hemorrhage, hydrocephalus or extra-axial collection.

Vascular: Major vessels at the base of the brain show flow.

Skull and upper cervical spine: Negative

Sinuses/Orbits: Clear sinuses.  Orbits appear normal.

Other: No fluid in the middle ears or mastoids.
IMPRESSION: No acute finding to explain the clinical presentation. Normal study
with exception of 2 likely incidental punctate foci of T2 and FLAIR
signal within the subcortical white matter of the right posterior
frontal lobe.

## 2021-06-15 DIAGNOSIS — R29898 Other symptoms and signs involving the musculoskeletal system: Secondary | ICD-10-CM | POA: Diagnosis not present

## 2021-06-15 DIAGNOSIS — M542 Cervicalgia: Secondary | ICD-10-CM | POA: Diagnosis not present

## 2021-06-15 DIAGNOSIS — R531 Weakness: Secondary | ICD-10-CM | POA: Diagnosis not present

## 2021-06-20 DIAGNOSIS — R531 Weakness: Secondary | ICD-10-CM | POA: Diagnosis not present

## 2021-06-20 DIAGNOSIS — R29898 Other symptoms and signs involving the musculoskeletal system: Secondary | ICD-10-CM | POA: Diagnosis not present

## 2021-06-20 DIAGNOSIS — M542 Cervicalgia: Secondary | ICD-10-CM | POA: Diagnosis not present

## 2021-06-28 DIAGNOSIS — I10 Essential (primary) hypertension: Secondary | ICD-10-CM | POA: Diagnosis not present

## 2021-06-28 DIAGNOSIS — Z6828 Body mass index (BMI) 28.0-28.9, adult: Secondary | ICD-10-CM | POA: Diagnosis not present

## 2021-06-28 DIAGNOSIS — M5412 Radiculopathy, cervical region: Secondary | ICD-10-CM | POA: Diagnosis not present

## 2021-06-28 DIAGNOSIS — M542 Cervicalgia: Secondary | ICD-10-CM | POA: Diagnosis not present

## 2021-06-29 DIAGNOSIS — R531 Weakness: Secondary | ICD-10-CM | POA: Diagnosis not present

## 2021-06-29 DIAGNOSIS — M542 Cervicalgia: Secondary | ICD-10-CM | POA: Diagnosis not present

## 2021-06-29 DIAGNOSIS — R29898 Other symptoms and signs involving the musculoskeletal system: Secondary | ICD-10-CM | POA: Diagnosis not present

## 2021-08-09 ENCOUNTER — Ambulatory Visit (HOSPITAL_COMMUNITY)
Admission: RE | Admit: 2021-08-09 | Discharge: 2021-08-09 | Disposition: A | Discharge status: Home / Self Care | Payer: BC Managed Care – PPO | Source: Ambulatory Visit | Attending: Internal Medicine | Admitting: Internal Medicine

## 2021-08-09 ENCOUNTER — Other Ambulatory Visit (HOSPITAL_COMMUNITY): Payer: Self-pay | Admitting: Internal Medicine

## 2021-08-09 DIAGNOSIS — R2243 Localized swelling, mass and lump, lower limb, bilateral: Secondary | ICD-10-CM

## 2021-08-09 DIAGNOSIS — R110 Nausea: Secondary | ICD-10-CM | POA: Diagnosis not present

## 2021-08-09 DIAGNOSIS — M7989 Other specified soft tissue disorders: Secondary | ICD-10-CM | POA: Diagnosis not present

## 2021-12-08 DIAGNOSIS — Z125 Encounter for screening for malignant neoplasm of prostate: Secondary | ICD-10-CM | POA: Diagnosis not present

## 2021-12-08 DIAGNOSIS — R7989 Other specified abnormal findings of blood chemistry: Secondary | ICD-10-CM | POA: Diagnosis not present

## 2021-12-13 DIAGNOSIS — Z0000 Encounter for general adult medical examination without abnormal findings: Secondary | ICD-10-CM | POA: Diagnosis not present

## 2021-12-15 DIAGNOSIS — L821 Other seborrheic keratosis: Secondary | ICD-10-CM | POA: Diagnosis not present

## 2021-12-15 DIAGNOSIS — L570 Actinic keratosis: Secondary | ICD-10-CM | POA: Diagnosis not present

## 2021-12-15 DIAGNOSIS — L578 Other skin changes due to chronic exposure to nonionizing radiation: Secondary | ICD-10-CM | POA: Diagnosis not present

## 2021-12-15 DIAGNOSIS — D225 Melanocytic nevi of trunk: Secondary | ICD-10-CM | POA: Diagnosis not present

## 2022-02-07 DIAGNOSIS — M542 Cervicalgia: Secondary | ICD-10-CM | POA: Diagnosis not present

## 2022-02-07 DIAGNOSIS — M25512 Pain in left shoulder: Secondary | ICD-10-CM | POA: Diagnosis not present

## 2022-12-04 ENCOUNTER — Ambulatory Visit: Payer: Medicare Other | Admitting: Neurology

## 2023-03-26 ENCOUNTER — Ambulatory Visit: Payer: Medicare Other | Admitting: Neurology

## 2023-04-25 ENCOUNTER — Encounter: Payer: Self-pay | Admitting: Neurology

## 2023-04-25 ENCOUNTER — Ambulatory Visit: Payer: Medicare Other | Admitting: Neurology

## 2023-04-25 ENCOUNTER — Ambulatory Visit (INDEPENDENT_AMBULATORY_CARE_PROVIDER_SITE_OTHER): Payer: No Typology Code available for payment source | Admitting: Neurology

## 2023-04-25 VITALS — BP 139/89 | HR 95 | Ht 68.0 in | Wt 174.5 lb

## 2023-04-25 DIAGNOSIS — G43E09 Chronic migraine with aura, not intractable, without status migrainosus: Secondary | ICD-10-CM

## 2023-04-25 DIAGNOSIS — R4189 Other symptoms and signs involving cognitive functions and awareness: Secondary | ICD-10-CM | POA: Insufficient documentation

## 2023-04-25 DIAGNOSIS — R413 Other amnesia: Secondary | ICD-10-CM

## 2023-04-25 NOTE — Progress Notes (Signed)
 Chief Complaint  Patient presents with   Migraine    Rm12, alone, Migraine:2 in past 30 days. Has: 3 in past 30 days. Triggers: stress, light.       ASSESSMENT AND PLAN  Sean Pugh is a 68 y.o. male   Mild cognitive impairment Chronic migraine with aura  MRI of the brain without contrast,  Laboratory evaluation to rule out structural abnormality  Follow-up depend above workup result  DIAGNOSTIC DATA (LABS, IMAGING, TESTING) - I reviewed patient records, labs, notes, testing and imaging myself where available.   MEDICAL HISTORY:  Sean Pugh is a 68 year old male, seen in request by his primary care from [facility] Dr. Link Snuffer, Lorin Picket, for evaluation of mental fogginess, long history of migraine,   he was seen by Dr. Anne Hahn and Maralyn Sago in the past    History is obtained from the patient and review of electronic medical records. I personally reviewed pertinent available imaging films in PACS.   PMHx of  Kidney Stone Chronic migraine Right shoulder surgery.  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, grandparents suffered dementia  He went through a lot of stress recently, mother passed away in 02/15/25wife is dealing with squamous cell skin cancer, 32 year old son lives with them, suffered frequent neuropathic pain, he noticed brain fogginess since he suffered COVID infection in October 2024, had a high fever for 1 week, upper respiratory symptoms  Since then, he noticed mild difficulty recalling information, missing deadlines, working quality seems not to be the same  MoCA examination 27/30, has mild short-term memory loss, and visual-spatial disorientation  He suffered lifelong history of chronic migraine headaches with visual aura, often proceeding with visual aura, twisted flashing light in his visual field, sometimes proceeding with left chin area numbness, Had MRI of the brain June 2021  which showed no significant abnormality, MRA of the brain and neck showed no large vessel disease  Over the years, he has learned to deal with headache, taking Advil early, sleep usually helps,    PHYSICAL EXAM:   Vitals:   04/25/23 1400  BP: 139/89  Pulse: 95  Weight: 174 lb 8 oz (79.2 kg)  Height: 5\' 8"  (1.727 m)      Body mass index is 26.53 kg/m.  PHYSICAL EXAMNIATION:  Gen: NAD, conversant, well nourised, well groomed                     Cardiovascular: Regular rate rhythm, no peripheral edema, warm, nontender. Eyes: Conjunctivae clear without exudates or hemorrhage Neck: Supple, no carotid bruits. Pulmonary: Clear to auscultation bilaterally   NEUROLOGICAL EXAM:  MENTAL STATUS: Speech/cognition: Awake, alert, oriented to history taking and casual conversation CRANIAL NERVES: CN II: Visual fields are full to confrontation. Pupils are round equal and briskly reactive to light. CN III, IV, VI: extraocular movement are normal. No ptosis. CN V: Facial sensation is intact to light touch CN VII: Face is symmetric with normal eye closure  CN VIII: Hearing is normal to causal conversation. CN IX, X: Phonation is normal. CN XI: Head turning and shoulder shrug are intact  MOTOR: There is no pronator drift of out-stretched arms. Muscle bulk and tone are normal. Muscle strength is normal.  REFLEXES: Reflexes are 2+ and symmetric at the biceps, triceps, knees, and ankles. Plantar responses are flexor.  SENSORY: Intact to light touch, pinprick and vibratory sensation are intact in fingers and toes.  COORDINATION: There is no trunk or limb dysmetria noted.  GAIT/STANCE: Posture is normal. Gait is steady with normal steps, base, arm swing, and turning. Heel and toe walking are normal. Tandem gait is normal.  Romberg is absent.  REVIEW OF SYSTEMS:  Full 14 system review of systems performed and notable only for as above All other review of systems were  negative.   ALLERGIES: Allergies  Allergen Reactions   Ivp Dye [Iodinated Contrast Media]     Swelling, rash   Latex    Shellfish Allergy    Soybean-Containing Drug Products    Sulfa Antibiotics Rash    HOME MEDICATIONS: Current Outpatient Medications  Medication Sig Dispense Refill   aspirin 81 MG tablet Take 81 mg by mouth daily.     ibuprofen (ADVIL,MOTRIN) 200 MG tablet Take by mouth every 6 (six) hours as needed. 1 -2 tabs prn headache.     Multiple Vitamin (MULTIVITAMIN) tablet Take 1 tablet by mouth daily.     No current facility-administered medications for this visit.    PAST MEDICAL HISTORY: Past Medical History:  Diagnosis Date   Basal cell carcinoma    Skin    Kidney stones    Memory loss    Migraine headache without aura    Sciatica 11/10/2012   Left-sided sciatica   Scoliosis    Tremor     PAST SURGICAL HISTORY: Past Surgical History:  Procedure Laterality Date   HERNIA REPAIR     KIDNEY STONE SURGERY     SHOULDER SURGERY Right     FAMILY HISTORY: Family History  Problem Relation Age of Onset   Cancer Father    Stroke Paternal Grandmother    Cancer Paternal Grandfather    Multiple sclerosis Cousin    Diabetes Cousin    Migraines Son     SOCIAL HISTORY: Social History   Socioeconomic History   Marital status: Married    Spouse name: Claris Gower    Number of children: 1   Years of education: COLLEGE   Highest education level: Bachelor's degree (e.g., BA, AB, BS)  Occupational History   Not on file  Tobacco Use   Smoking status: Never   Smokeless tobacco: Never  Vaping Use   Vaping status: Never Used  Substance and Sexual Activity   Alcohol use: Yes    Alcohol/week: 1.0 standard drink of alcohol    Types: 1 Standard drinks or equivalent per week    Comment: occasional   Drug use: No   Sexual activity: Not on file  Other Topics Concern   Not on file  Social History Narrative   Patient lives at home with wife and son. 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    Patient has one child.    Social Drivers of Corporate investment banker Strain: Not on file  Food Insecurity: Not on file  Transportation Needs: Not on file  Physical Activity: Not on file  Stress: Not on file  Social Connections: Not on file  Intimate Partner Violence: Not on file      Levert Feinstein, M.D. Ph.D.  [facility] [address] [place], [place] [address] Ph: [phone] Fax: [phone]  CC:  Alysia Penna, MD [address] [place],  [place] [address]  Alysia Penna, MD

## 2023-04-25 NOTE — Patient Instructions (Signed)
 trazodone ?

## 2023-04-26 LAB — CBC WITH DIFFERENTIAL/PLATELET
Basophils Absolute: 0.1 10*3/uL (ref 0.0–0.2)
Basos: 1 %
EOS (ABSOLUTE): 0.5 10*3/uL — ABNORMAL HIGH (ref 0.0–0.4)
Eos: 5 %
Hematocrit: 48.7 % (ref 37.5–51.0)
Hemoglobin: 16.3 g/dL (ref 13.0–17.7)
Immature Grans (Abs): 0 10*3/uL (ref 0.0–0.1)
Immature Granulocytes: 0 %
Lymphocytes Absolute: 2.1 10*3/uL (ref 0.7–3.1)
Lymphs: 22 %
MCH: 29 pg (ref 26.6–33.0)
MCHC: 33.5 g/dL (ref 31.5–35.7)
MCV: 87 fL (ref 79–97)
Monocytes Absolute: 0.8 10*3/uL (ref 0.1–0.9)
Monocytes: 8 %
Neutrophils Absolute: 6 10*3/uL (ref 1.4–7.0)
Neutrophils: 64 %
Platelets: 278 10*3/uL (ref 150–450)
RBC: 5.63 x10E6/uL (ref 4.14–5.80)
RDW: 13.1 % (ref 11.6–15.4)
WBC: 9.4 10*3/uL (ref 3.4–10.8)

## 2023-04-26 LAB — COMPREHENSIVE METABOLIC PANEL
ALT: 30 [IU]/L (ref 0–44)
AST: 22 [IU]/L (ref 0–40)
Albumin: 4.5 g/dL (ref 3.9–4.9)
Alkaline Phosphatase: 80 [IU]/L (ref 44–121)
BUN/Creatinine Ratio: 11 (ref 10–24)
BUN: 13 mg/dL (ref 8–27)
Bilirubin Total: 0.5 mg/dL (ref 0.0–1.2)
CO2: 23 mmol/L (ref 20–29)
Calcium: 9.4 mg/dL (ref 8.6–10.2)
Chloride: 103 mmol/L (ref 96–106)
Creatinine, Ser: 1.16 mg/dL (ref 0.76–1.27)
Globulin, Total: 2.5 g/dL (ref 1.5–4.5)
Glucose: 93 mg/dL (ref 70–99)
Potassium: 4.7 mmol/L (ref 3.5–5.2)
Sodium: 142 mmol/L (ref 134–144)
Total Protein: 7 g/dL (ref 6.0–8.5)
eGFR: 69 mL/min/{1.73_m2} (ref >59)

## 2023-04-26 LAB — RPR: RPR Ser Ql: NONREACTIVE

## 2023-04-26 LAB — TSH: TSH: 1.41 u[IU]/mL (ref 0.450–4.500)

## 2023-04-26 LAB — VITAMIN B12: Vitamin B-12: 700 pg/mL (ref 232–1245)

## 2023-05-01 ENCOUNTER — Telehealth: Payer: Self-pay | Admitting: Neurology

## 2023-05-01 NOTE — Telephone Encounter (Signed)
 sent to [facility] they obtain Drucie Opitz [phone]

## 2023-05-03 ENCOUNTER — Encounter: Payer: Self-pay | Admitting: Neurology

## 2023-05-09 ENCOUNTER — Encounter: Payer: Self-pay | Admitting: Neurology

## 2023-05-13 ENCOUNTER — Encounter: Payer: Self-pay | Admitting: Neurology

## 2023-05-20 ENCOUNTER — Ambulatory Visit
Admission: RE | Admit: 2023-05-20 | Discharge: 2023-05-20 | Disposition: A | Discharge status: Home / Self Care | Source: Ambulatory Visit | Attending: Neurology | Admitting: Neurology

## 2023-05-20 DIAGNOSIS — G43E09 Chronic migraine with aura, not intractable, without status migrainosus: Secondary | ICD-10-CM

## 2023-05-20 DIAGNOSIS — R4189 Other symptoms and signs involving cognitive functions and awareness: Secondary | ICD-10-CM | POA: Diagnosis not present

## 2023-05-23 ENCOUNTER — Encounter: Payer: Self-pay | Admitting: Neurology

## 2024-01-24 ENCOUNTER — Ambulatory Visit: Payer: Self-pay | Admitting: Family Medicine

## 2024-04-17 ENCOUNTER — Ambulatory Visit: Admitting: Family Medicine
